# Patient Record
Sex: Female | Born: 1985
Health system: Southern US, Community
[De-identification: ages and names within clinical notes are randomized; demographics above are authoritative.]

## PROBLEM LIST (undated history)

## (undated) DIAGNOSIS — E785 Hyperlipidemia, unspecified: Secondary | ICD-10-CM

## (undated) DIAGNOSIS — E119 Type 2 diabetes mellitus without complications: Secondary | ICD-10-CM

## (undated) DIAGNOSIS — K219 Gastro-esophageal reflux disease without esophagitis: Secondary | ICD-10-CM

## (undated) DIAGNOSIS — N393 Stress incontinence (female) (male): Secondary | ICD-10-CM

## (undated) DIAGNOSIS — T7840XA Allergy, unspecified, initial encounter: Secondary | ICD-10-CM

## (undated) DIAGNOSIS — I10 Essential (primary) hypertension: Secondary | ICD-10-CM

## (undated) HISTORY — DX: Essential (primary) hypertension: I10

## (undated) HISTORY — DX: Allergy, unspecified, initial encounter: T78.40XA

## (undated) HISTORY — DX: Hyperlipidemia, unspecified: E78.5

## (undated) HISTORY — DX: Stress incontinence (female) (male): N39.3

---
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
## 2005-04-28 ENCOUNTER — Ambulatory Visit: Payer: Self-pay | Admitting: Internal Medicine

## 2005-05-19 ENCOUNTER — Other Ambulatory Visit: Admission: RE | Admit: 2005-05-19 | Discharge: 2005-05-19 | Payer: Self-pay | Admitting: Obstetrics and Gynecology

## 2006-01-06 ENCOUNTER — Other Ambulatory Visit: Admission: RE | Admit: 2006-01-06 | Discharge: 2006-01-06 | Payer: Self-pay | Admitting: Gynecology

## 2006-01-08 ENCOUNTER — Encounter: Admission: RE | Admit: 2006-01-08 | Discharge: 2006-01-08 | Payer: Self-pay | Admitting: *Deleted

## 2008-04-10 LAB — CONVERTED CEMR LAB: Pap Smear: NORMAL

## 2008-04-12 ENCOUNTER — Encounter (INDEPENDENT_AMBULATORY_CARE_PROVIDER_SITE_OTHER): Payer: Self-pay | Admitting: Nurse Practitioner

## 2008-04-12 LAB — CONVERTED CEMR LAB
GC Probe Amp, Genital: NEGATIVE
RPR Titer: NONREACTIVE

## 2008-04-13 ENCOUNTER — Encounter (INDEPENDENT_AMBULATORY_CARE_PROVIDER_SITE_OTHER): Payer: Self-pay | Admitting: Nurse Practitioner

## 2008-04-13 LAB — CONVERTED CEMR LAB: Pap Smear: NEGATIVE

## 2008-05-15 ENCOUNTER — Ambulatory Visit: Payer: Self-pay | Admitting: Nurse Practitioner

## 2008-05-15 DIAGNOSIS — Z6841 Body Mass Index (BMI) 40.0 and over, adult: Secondary | ICD-10-CM | POA: Insufficient documentation

## 2008-05-15 DIAGNOSIS — K029 Dental caries, unspecified: Secondary | ICD-10-CM | POA: Insufficient documentation

## 2008-05-15 DIAGNOSIS — F17200 Nicotine dependence, unspecified, uncomplicated: Secondary | ICD-10-CM | POA: Insufficient documentation

## 2008-05-15 DIAGNOSIS — R030 Elevated blood-pressure reading, without diagnosis of hypertension: Secondary | ICD-10-CM | POA: Insufficient documentation

## 2008-05-16 ENCOUNTER — Encounter (INDEPENDENT_AMBULATORY_CARE_PROVIDER_SITE_OTHER): Payer: Self-pay | Admitting: Nurse Practitioner

## 2008-05-23 ENCOUNTER — Encounter (INDEPENDENT_AMBULATORY_CARE_PROVIDER_SITE_OTHER): Payer: Self-pay | Admitting: Nurse Practitioner

## 2008-06-01 ENCOUNTER — Encounter (INDEPENDENT_AMBULATORY_CARE_PROVIDER_SITE_OTHER): Payer: Self-pay | Admitting: Nurse Practitioner

## 2016-02-13 DIAGNOSIS — Z202 Contact with and (suspected) exposure to infections with a predominantly sexual mode of transmission: Secondary | ICD-10-CM | POA: Diagnosis not present

## 2016-02-13 DIAGNOSIS — Z5181 Encounter for therapeutic drug level monitoring: Secondary | ICD-10-CM | POA: Diagnosis not present

## 2016-02-13 DIAGNOSIS — Z0000 Encounter for general adult medical examination without abnormal findings: Secondary | ICD-10-CM | POA: Diagnosis not present

## 2016-06-22 DIAGNOSIS — Z113 Encounter for screening for infections with a predominantly sexual mode of transmission: Secondary | ICD-10-CM | POA: Diagnosis not present

## 2016-06-22 DIAGNOSIS — N760 Acute vaginitis: Secondary | ICD-10-CM | POA: Diagnosis not present

## 2016-06-22 DIAGNOSIS — Z114 Encounter for screening for human immunodeficiency virus [HIV]: Secondary | ICD-10-CM | POA: Diagnosis not present

## 2016-06-22 DIAGNOSIS — Z01419 Encounter for gynecological examination (general) (routine) without abnormal findings: Secondary | ICD-10-CM | POA: Diagnosis not present

## 2016-06-22 DIAGNOSIS — Z6841 Body Mass Index (BMI) 40.0 and over, adult: Secondary | ICD-10-CM | POA: Diagnosis not present

## 2016-06-22 DIAGNOSIS — Z1159 Encounter for screening for other viral diseases: Secondary | ICD-10-CM | POA: Diagnosis not present

## 2016-07-13 ENCOUNTER — Ambulatory Visit: Payer: Self-pay | Admitting: Obstetrics and Gynecology

## 2016-07-20 ENCOUNTER — Ambulatory Visit (INDEPENDENT_AMBULATORY_CARE_PROVIDER_SITE_OTHER)
Admission: RE | Admit: 2016-07-20 | Discharge: 2016-07-20 | Disposition: A | Discharge status: Home / Self Care | Payer: BLUE CROSS/BLUE SHIELD | Source: Ambulatory Visit | Attending: Family Medicine | Admitting: Family Medicine

## 2016-07-20 ENCOUNTER — Encounter: Payer: Self-pay | Admitting: Family Medicine

## 2016-07-20 ENCOUNTER — Ambulatory Visit (INDEPENDENT_AMBULATORY_CARE_PROVIDER_SITE_OTHER): Payer: BLUE CROSS/BLUE SHIELD | Admitting: Family Medicine

## 2016-07-20 VITALS — BP 134/78 | HR 96 | Temp 97.8°F | Resp 12 | Ht 62.5 in | Wt 268.0 lb

## 2016-07-20 DIAGNOSIS — G8929 Other chronic pain: Secondary | ICD-10-CM | POA: Insufficient documentation

## 2016-07-20 DIAGNOSIS — M25562 Pain in left knee: Secondary | ICD-10-CM

## 2016-07-20 DIAGNOSIS — F17200 Nicotine dependence, unspecified, uncomplicated: Secondary | ICD-10-CM

## 2016-07-20 DIAGNOSIS — Z6841 Body Mass Index (BMI) 40.0 and over, adult: Secondary | ICD-10-CM | POA: Diagnosis not present

## 2016-07-20 DIAGNOSIS — M549 Dorsalgia, unspecified: Secondary | ICD-10-CM

## 2016-07-20 DIAGNOSIS — K625 Hemorrhage of anus and rectum: Secondary | ICD-10-CM | POA: Diagnosis not present

## 2016-07-20 DIAGNOSIS — M545 Low back pain, unspecified: Secondary | ICD-10-CM | POA: Insufficient documentation

## 2016-07-20 LAB — COMPREHENSIVE METABOLIC PANEL
ALBUMIN: 4.3 g/dL (ref 3.5–5.2)
ALK PHOS: 69 U/L (ref 39–117)
ALT: 13 U/L (ref 0–35)
AST: 11 U/L (ref 0–37)
BUN: 10 mg/dL (ref 6–23)
CALCIUM: 9.3 mg/dL (ref 8.4–10.5)
CHLORIDE: 104 meq/L (ref 96–112)
CO2: 28 mEq/L (ref 19–32)
CREATININE: 0.77 mg/dL (ref 0.40–1.20)
GFR: 112.87 mL/min (ref >60.00)
Glucose, Bld: 86 mg/dL (ref 70–99)
Potassium: 4.1 mEq/L (ref 3.5–5.1)
Sodium: 140 mEq/L (ref 135–145)
TOTAL PROTEIN: 6.8 g/dL (ref 6.0–8.3)
Total Bilirubin: 0.6 mg/dL (ref 0.2–1.2)

## 2016-07-20 LAB — CBC
HEMATOCRIT: 36.7 % (ref 36.0–46.0)
Hemoglobin: 12.2 g/dL (ref 12.0–15.0)
MCHC: 33.2 g/dL (ref 30.0–36.0)
MCV: 89.9 fl (ref 78.0–100.0)
Platelets: 379 10*3/uL (ref 150.0–400.0)
RBC: 4.08 Mil/uL (ref 3.87–5.11)
RDW: 13.5 % (ref 11.5–15.5)
WBC: 8.4 10*3/uL (ref 4.0–10.5)

## 2016-07-20 LAB — LIPID PANEL
CHOLESTEROL: 165 mg/dL (ref 0–200)
HDL: 34.7 mg/dL — ABNORMAL LOW (ref >39.00)
LDL CALC: 113 mg/dL — AB (ref 0–99)
NonHDL: 130.11
TRIGLYCERIDES: 87 mg/dL (ref 0.0–149.0)
Total CHOL/HDL Ratio: 5
VLDL: 17.4 mg/dL (ref 0.0–40.0)

## 2016-07-20 MED ORDER — CYCLOBENZAPRINE HCL 10 MG PO TABS: 10.0000 mg | ORAL_TABLET | Freq: Every evening | ORAL | 0 refills | Status: DC | PRN

## 2016-07-20 MED ORDER — NICOTINE POLACRILEX 4 MG MT GUM: CHEWING_GUM | OROMUCOSAL | 1 refills | Status: DC

## 2016-07-20 NOTE — Patient Instructions (Addendum)
A few things to remember from today's visit:   Rectal bleeding - Plan: Comprehensive metabolic panel, CBC  Chronic pain of left knee - Plan: DG Knee Complete 4 Views Left  Chronic left-sided low back pain without sciatica - Plan: cyclobenzaprine (FLEXERIL) 10 MG tablet, DG Lumbar Spine Complete  BMI 45.0-49.9, adult (HCC) - Plan: Lipid panel, Comprehensive metabolic panel  What are some tips for weight loss? People become overweight for many reasons. Weight issues can run in families. They can be caused by unhealthy behaviors and a person's environment. Certain health problems and medicines can also lead to weight gain. There are some simple things you can do to reach and maintain a healthy weight:  Eat small more frequent healthy meals instead 3 bid meals. Also Weight Watchers is a good option. Avoid sweet drinks. These include regular soft drinks, fruit juices, fruit drinks, energy drinks, sweetened iced tea, and flavored milk. Avoid fast foods. Fast foods such as french fries, hamburgers, chicken nuggets, and pizza are high in calories and can cause weight gain. Eat a healthy breakfast. People who skip breakfast tend to weigh more. Don't watch more than two hours of television per day. Chew sugar-free gum between meals to cut down on snacking. Avoid grocery shopping when you're hungry. Pack a healthy lunch instead of eating out to control what and how much you eat. Eat a lot of fruits and vegetables. Aim for about 2 cups of fruit and 2 to 3 cups of vegetables per day. Aim for 150 minutes per week of moderate-intensity exercise (such as brisk walking), or 75 minutes per week of vigorous exercise (such as jogging or running). OR 15-30 min of daily brisk walking. Be more active. Small changes in physical activity can easily be added to your daily routine. For example, take the stairs instead of the elevator. Take a walk with your family. A daily walk is a great way to get exercise and to  catch up on the day's events.  Flexeril is a muscle relaxant that can cause drowsiness, so take it at night.  Please be sure medication list is accurate. If a new problem present, please set up appointment sooner than planned today.
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
## 2016-07-22 ENCOUNTER — Other Ambulatory Visit: Payer: Self-pay

## 2016-07-22 ENCOUNTER — Telehealth: Payer: Self-pay | Admitting: Family Medicine

## 2016-07-22 DIAGNOSIS — M25562 Pain in left knee: Principal | ICD-10-CM

## 2016-07-22 DIAGNOSIS — G8929 Other chronic pain: Secondary | ICD-10-CM

## 2016-07-22 NOTE — Telephone Encounter (Signed)
Patient informed of results and verbalized understanding.

## 2016-07-22 NOTE — Telephone Encounter (Signed)
Pt would like results of labs. Please call back. °

## 2016-09-21 DIAGNOSIS — M25562 Pain in left knee: Secondary | ICD-10-CM | POA: Diagnosis not present

## 2016-09-21 DIAGNOSIS — M25532 Pain in left wrist: Secondary | ICD-10-CM | POA: Diagnosis not present

## 2016-09-21 DIAGNOSIS — M25531 Pain in right wrist: Secondary | ICD-10-CM | POA: Diagnosis not present

## 2017-01-21 ENCOUNTER — Ambulatory Visit: Payer: BLUE CROSS/BLUE SHIELD | Admitting: Family Medicine

## 2017-03-02 ENCOUNTER — Encounter: Payer: Self-pay | Admitting: Family Medicine

## 2017-03-02 ENCOUNTER — Ambulatory Visit (INDEPENDENT_AMBULATORY_CARE_PROVIDER_SITE_OTHER): Payer: BLUE CROSS/BLUE SHIELD | Admitting: Family Medicine

## 2017-03-02 VITALS — BP 128/80 | HR 80 | Temp 97.9°F | Resp 12 | Ht 62.5 in | Wt 283.0 lb

## 2017-03-02 DIAGNOSIS — H9203 Otalgia, bilateral: Secondary | ICD-10-CM | POA: Diagnosis not present

## 2017-03-02 DIAGNOSIS — J029 Acute pharyngitis, unspecified: Secondary | ICD-10-CM | POA: Diagnosis not present

## 2017-03-02 DIAGNOSIS — J069 Acute upper respiratory infection, unspecified: Secondary | ICD-10-CM | POA: Diagnosis not present

## 2017-03-02 LAB — POCT RAPID STREP A (OFFICE): Rapid Strep A Screen: NEGATIVE

## 2017-03-02 MED ORDER — BENZONATATE 100 MG PO CAPS: 200.0000 mg | ORAL_CAPSULE | Freq: Two times a day (BID) | ORAL | 0 refills | Status: AC | PRN

## 2017-03-02 MED ORDER — FLUTICASONE PROPIONATE 50 MCG/ACT NA SUSP: 1.0000 | Freq: Two times a day (BID) | NASAL | 0 refills | Status: DC

## 2017-03-02 NOTE — Patient Instructions (Addendum)
A few things to remember from today's visit:  ACUTE VISIT:  Acute pharyngitis, unspecified etiology  URI, acute - Plan: fluticasone (FLONASE) 50 MCG/ACT nasal spray, benzonatate (TESSALON) 100 MG capsule  Earache symptoms in both ears  viral infections are self-limited and we treat each symptom depending of severity.  Over the counter medications as decongestants and cold medications usually help, they need to be taken with caution if there is a history of high blood pressure or palpitations.  Plenty of fluids. Honey helps with cough. Steam inhalations helps with runny nose, nasal congestion, and may prevent sinus infections. Cough and nasal congestion could last a few days and sometimes weeks. Because smoking symptoms may last longer.   Popping ears may help with ear ache, ? Fluid in ears.  Symptomatic treatment: Over the counter Acetaminophen 500 mg and/or Ibuprofen (400-600 mg) if there is not contraindications; you can alternate in between both every 4-6 hours. Gargles with saline water and throat lozenges might also help. Cold fluids.    Seek prompt medical evaluation if you are having difficulty breathing, mouth swelling, throat closing up, not able to swallow liquids (drooling), skin rash/bruising, or worsening symptoms.  Please follow up in 2 weeks if not any better.    Please be sure medication list is accurate. If a new problem present, please set up appointment sooner than planned today.

## 2017-03-02 NOTE — Progress Notes (Signed)
HPI:  ACUTE VISIT  Chief Complaint  Patient presents with  . Sore Throat    Ms.GrenadaBrittany Jerilynn Birkenheadicole Thompson is a 31 y.o.female here today with her female friend complaining of 7 days of respiratory symptoms.  Initially she had fever and chills but these resolved 1-2 days later. + Sore throat, she would like to be checked for strep.  Sore Throat   This is a new problem. The current episode started in the past 7 days. Neither side of throat is experiencing more pain than the other. The pain is moderate. Associated symptoms include coughing and ear pain. Pertinent negatives include no abdominal pain, congestion, diarrhea, ear discharge, headaches, hoarse voice, neck pain, shortness of breath, stridor, swollen glands, trouble swallowing or vomiting. She has had no exposure to strep or mono. She has tried NSAIDs for the symptoms. The treatment provided mild relief.   + Bilateral earache L>R. Denies hearing loss or ear drainage.  Productive cough with clear "mucus." She has not noted nasal congestion or rhinorrhea but has had post nasal drainage. She is not longer having fever,chills,or body aches. She deneis chest pain, dyspnea, or wheezing.  No Hx of recent travel. No sick contact. No known insect bite. + Smoker.  Hx of allergies: yes, she takes Claritin as needed.  OTC medications for this problem: Ibuprofen, none today.  Symptoms otherwise stable.   Review of Systems  Constitutional: Positive for fatigue. Negative for activity change, appetite change and fever.  HENT: Positive for ear pain, postnasal drip and sore throat. Negative for congestion, ear discharge, hoarse voice, mouth sores, nosebleeds, rhinorrhea, sinus pressure, trouble swallowing and voice change.   Eyes: Negative for discharge, redness and itching.  Respiratory: Positive for cough. Negative for shortness of breath, wheezing and stridor.   Cardiovascular: Negative for chest pain and leg swelling.    Gastrointestinal: Negative for abdominal pain, diarrhea, nausea and vomiting.  Musculoskeletal: Negative for myalgias and neck pain.  Skin: Negative for pallor and rash.  Allergic/Immunologic: Positive for environmental allergies.  Neurological: Negative for weakness and headaches.  Hematological: Negative for adenopathy. Does not bruise/bleed easily.    Current Outpatient Prescriptions on File Prior to Visit  Medication Sig Dispense Refill  . nicotine polacrilex (NICORETTE) 4 MG gum q 3 hours as needed for smoking cessation. 150 tablet 1   No current facility-administered medications on file prior to visit.      Past Medical History:  Diagnosis Date  . Stress incontinence of urine    Not on File  Social History   Social History  . Marital status: Single    Spouse name: N/A  . Number of children: N/A  . Years of education: N/A   Social History Main Topics  . Smoking status: Current Every Day Smoker  . Smokeless tobacco: Never Used  . Alcohol use Yes     Comment: social  . Drug use: Yes    Types: Marijuana  . Sexual activity: Not Asked   Other Topics Concern  . None   Social History Narrative  . None    Vitals:   03/02/17 1556  BP: 128/80  Pulse: 80  Resp: 12  Temp: 97.9 F (36.6 C)   Body mass index is 50.94 kg/m.   Physical Exam  Nursing note and vitals reviewed. Constitutional: She is oriented to person, place, and time. She appears well-developed. She does not appear ill. No distress.  HENT:  Head: Atraumatic.  Right Ear: Tympanic membrane, external ear  and ear canal normal.  Left Ear: Tympanic membrane, external ear and ear canal normal.  Nose: Rhinorrhea present. Right sinus exhibits no maxillary sinus tenderness and no frontal sinus tenderness. Left sinus exhibits no maxillary sinus tenderness and no frontal sinus tenderness.  Mouth/Throat: Uvula is midline, oropharynx is clear and moist and mucous membranes are normal.  Cerumen in both ear  canals, moderate amount. Hypertrophic turbinates. Post nasal drainage.   Eyes: Conjunctivae and EOM are normal.  Neck: No muscular tenderness present.  Cardiovascular: Normal rate and regular rhythm.   No murmur heard. Respiratory: Effort normal and breath sounds normal. No stridor. No respiratory distress.  Lymphadenopathy:       Head (right side): No submandibular adenopathy present.       Head (left side): No submandibular adenopathy present.    She has no cervical adenopathy.  Neurological: She is alert and oriented to person, place, and time. She has normal strength.  Skin: Skin is warm. No rash noted. No erythema.  Psychiatric: She has a normal mood and affect. Her speech is normal.  Well groomed, good eye contact.    ASSESSMENT AND PLAN:   Meredith was seen today for sore throat.  Diagnoses and all orders for this visit:  Acute pharyngitis, unspecified etiology  We discussed possible etiologies, examination does not suggest strep infection. Rapid strep negative. We will follow Cx and will given recommendations accordingly. Symptomatic treatment recommend.   URI, acute  Symptoms suggests a viral etiology, now having residual symptoms, not longer having fever. Instructed to monitor for signs of complications, including new onset of fever among some, clearly instructed about warning signs. I also explained that cough and nasal congestion can last a few days and sometimes weeks. F/U as needed.   -     fluticasone (FLONASE) 50 MCG/ACT nasal spray; Place 1 spray into both nostrils 2 (two) times daily. -     benzonatate (TESSALON) 100 MG capsule; Take 2 capsules (200 mg total) by mouth 2 (two) times daily as needed for cough.  Earache symptoms in both ears  TM on exam is not erythematous. ? Eustachian tube dysfunction. OTC decongestants may help, some side effects discussed. Auto inflation maneuvers may also help. F/U as needed.    -[name] was  advised to seek attention immediately if symptoms worsen or to follow if they persist or new concerns arise. She also needs to arrange CPE around 07/2017.      Betty G. [place], MD  [facility]. [place] office.

## 2017-03-04 ENCOUNTER — Encounter: Payer: Self-pay | Admitting: Family Medicine

## 2017-03-04 LAB — CULTURE, GROUP A STREP

## 2017-03-15 NOTE — Progress Notes (Signed)
HPI:   [name] is a 31 y.o. female, who is here today with her boyfriend for her routine physical. She is planning on going back to school, [employer] she needs form fill out and vaccines update.  As part of her program she will travel overseas, not sure which country, but she thinks it will be in [place].   Regular exercise 3 or more time per week: Not consistently. Following a healthy diet: Not consistently. She lives with her mother,step father, and her boyfriend.  Chronic medical problems: Tobacco use disorder,obesity, and allergies.  She follows with her gyn for her routine preventive gyn care.  She has Mirena IUD and she is c/o having intermittent vaginal spotting since it was placed,2017. LMP 3-4 months ago.  She is also concerned about diabetes. According to her boyfriend, she gets "very sleepy" every time she eats "a lot of sweets."She states that lately she has been [place] lot" of  ice cream and sweets, she has noted wt gain. She is concerned about diabetes.   Review of Systems  Constitutional: Positive for fatigue. Negative for appetite change, fever and unexpected weight change.  HENT: Negative for dental problem, hearing loss, mouth sores, sore throat, trouble swallowing and voice change.   Eyes: Negative for redness and visual disturbance.  Respiratory: Negative for cough, shortness of breath and wheezing.   Cardiovascular: Negative for chest pain, palpitations and leg swelling.  Gastrointestinal: Negative for abdominal pain, nausea and vomiting.       No changes in bowel habits.  Endocrine: Negative for cold intolerance, heat intolerance, polydipsia, polyphagia and polyuria.  Genitourinary: Positive for menstrual problem and vaginal bleeding. Negative for decreased urine volume, dysuria, hematuria and vaginal discharge.  Musculoskeletal: Negative for gait problem and myalgias.  Skin: Negative for color  change and rash.  Allergic/Immunologic: Positive for environmental allergies.  Neurological: Negative for syncope, weakness and headaches.  Hematological: Negative for adenopathy. Does not bruise/bleed easily.  Psychiatric/Behavioral: Negative for confusion and sleep disturbance. The patient is not nervous/anxious.   All other systems reviewed and are negative.     Current Outpatient Prescriptions on File Prior to Visit  Medication Sig Dispense Refill  . fluticasone (FLONASE) 50 MCG/ACT nasal spray Place 1 spray into both nostrils 2 (two) times daily. 16 g 0  . nicotine polacrilex (NICORETTE) 4 MG gum q 3 hours as needed for smoking cessation. 150 tablet 1   No current facility-administered medications on file prior to visit.      Past Medical History:  Diagnosis Date  . Allergy   . Stress incontinence of urine    Past Surgical History:  Procedure Laterality Date  . TONSILLECTOMY AND ADENOIDECTOMY  1999    No Known Allergies  Family History  Problem Relation Age of Onset  . Diabetes Mother   . Hyperlipidemia Mother   . Heart disease Mother        CHF  . Hypertension Mother   . Mental illness Mother   . Diabetes Father   . Hyperlipidemia Father   . Hypertension Father     Social History   Social History  . Marital status: Single    Spouse name: N/A  . Number of children: N/A  . Years of education: N/A   Social History Main Topics  . Smoking status: Current Every Day Smoker  . Smokeless tobacco: Never Used  . Alcohol use Yes     Comment: social  . Drug  use: Yes    Types: Marijuana  . Sexual activity: Yes    Birth control/ protection: IUD   Other Topics Concern  . None   Social History Narrative  . None     Vitals:   03/16/17 1357  BP: 120/80  Pulse: 87  Resp: 12   Body mass index is 49.68 kg/m.  O2 sat at RA 98%  Wt Readings from Last 3 Encounters:  03/16/17 276 lb (125.2 kg)  03/02/17 283 lb (128.4 kg)  07/20/16 268 lb (121.6 kg)     Physical Exam  Nursing note and vitals reviewed. Constitutional: She is oriented to person, place, and time. She appears well-developed. No distress.  HENT:  Head: Atraumatic.  Right Ear: Hearing, tympanic membrane, external ear and ear canal normal.  Left Ear: Hearing, tympanic membrane, external ear and ear canal normal.  Mouth/Throat: Uvula is midline, oropharynx is clear and moist and mucous membranes are normal.  Eyes: Conjunctivae and EOM are normal. Pupils are equal, round, and reactive to light.  Neck: No tracheal deviation present. No thyromegaly present.  Cardiovascular: Normal rate and regular rhythm.   No murmur heard. Pulses:      Dorsalis pedis pulses are 2+ on the right side, and 2+ on the left side.  Respiratory: Effort normal and breath sounds normal. No respiratory distress.  GI: Soft. She exhibits no mass. There is no hepatomegaly. There is no tenderness.  Musculoskeletal: She exhibits edema (trace pitting edema,bilateral).  No major deformity or signs of synovitis appreciated.  Lymphadenopathy:    She has no cervical adenopathy.       Right: No supraclavicular adenopathy present.       Left: No supraclavicular adenopathy present.  Neurological: She is alert and oriented to person, place, and time. She has normal strength. No cranial nerve deficit. Coordination and gait normal.  Reflex Scores:      Bicep reflexes are 2+ on the right side and 2+ on the left side.      Patellar reflexes are 2+ on the right side and 2+ on the left side. Skin: Skin is warm. No rash noted. No erythema.  Psychiatric: She has a normal mood and affect. Cognition and memory are normal.  Well groomed, good eye contact.    ASSESSMENT AND PLAN:   Diagnoses and all orders for this visit:  Routine physical examination   We discussed the importance of regular physical activity and healthy diet for prevention of chronic illness and/or complications. Preventive guidelines  reviewed. Vaccination updated. Because possible travel to [place] country Hep A also recommended.  Continue following with gyn for her routine pap smear. Also recommended scheduling f/u appt for Mirena check and intermittent vaginal spotting, recommend using condoms until verification that IUD is in place.  Form for school completed. Next CPE in 1-3 years.  -     POCT glycosylated hemoglobin (Hb A1C)  Diabetes mellitus screening -     POCT glycosylated hemoglobin (Hb A1C)  BMI 45.0-49.9, adult (HCC)  We discussed benefits of wt loss as well as adverse effects of obesity. Consistency with healthy diet and physical activity recommended. Portion controlled and aily brisk walking for 15-30 min as tolerated. A1C today mildly elevated, 5.8.  -     POCT glycosylated hemoglobin (Hb A1C)  Need for Tdap vaccination -     Tdap vaccine greater than or equal to 7yo IM  Need for hepatitis A and B vaccination -     Hepatitis A hepatitis  B combined vaccine IM      Kimbella Heisler G. [place], MD  [facility]. [place] office.

## 2017-03-16 ENCOUNTER — Ambulatory Visit: Payer: BLUE CROSS/BLUE SHIELD | Admitting: Family Medicine

## 2017-03-16 ENCOUNTER — Ambulatory Visit (INDEPENDENT_AMBULATORY_CARE_PROVIDER_SITE_OTHER): Payer: BLUE CROSS/BLUE SHIELD | Admitting: Family Medicine

## 2017-03-16 ENCOUNTER — Encounter: Payer: Self-pay | Admitting: Family Medicine

## 2017-03-16 VITALS — BP 120/80 | HR 87 | Resp 12 | Ht 62.5 in | Wt 276.0 lb

## 2017-03-16 DIAGNOSIS — Z131 Encounter for screening for diabetes mellitus: Secondary | ICD-10-CM | POA: Diagnosis not present

## 2017-03-16 DIAGNOSIS — Z23 Encounter for immunization: Secondary | ICD-10-CM

## 2017-03-16 DIAGNOSIS — Z0000 Encounter for general adult medical examination without abnormal findings: Secondary | ICD-10-CM

## 2017-03-16 DIAGNOSIS — Z6841 Body Mass Index (BMI) 40.0 and over, adult: Secondary | ICD-10-CM

## 2017-03-16 LAB — POCT GLYCOSYLATED HEMOGLOBIN (HGB A1C): Hemoglobin A1C: 5.8

## 2017-03-16 NOTE — Patient Instructions (Signed)
A few things to remember from today's visit:   Routine physical examination - Plan: POCT glycosylated hemoglobin (Hb A1C)  Diabetes mellitus screening - Plan: POCT glycosylated hemoglobin (Hb A1C)  BMI 45.0-49.9, adult (HCC) - Plan: POCT glycosylated hemoglobin (Hb A1C)   At least 150 minutes of moderate exercise per week, daily brisk walking for 15-30 min is a good exercise option. Healthy diet low in saturated (animal) fats and sweets and consisting of fresh fruits and vegetables, lean meats such as fish and white chicken and whole grains.   - Vaccines:  Tdap vaccine every 10 years.  Shingles vaccine recommended at age 31, could be given after 31 years of age but not sure about insurance coverage.  Pneumonia vaccines:  Prevnar 13 at 65 and Pneumovax at 66.  Screening recommendations for low/normal risk women:  Screening for diabetes at age 31-45 and every 3 years.  Cervical cancer prevention:  -HPV vaccination between 649-31 years old. -Pap smear starts at 31 years of age and continues periodically until 40108 years old in low risk women. Pap smear every 3 years between 7621 and 31 years old. Pap smear every 3 years between women 30 and older if pap smear negative and HPV screening negative.   -Breast cancer: Mammogram: There is disagreement between experts about when to start screening in low risk asymptomatic female but recent recommendations are to start screening at 640 and not later than 31 years old , every 1-2 years and after 31 yo q 2 years. Screening is recommended until 31 years old but some women can continue screening depending of healthy issues.   Colon cancer screening: starts at 31 years old until 31 years old.  Cholesterol disorder screening at age 31 and every 3 years.  Also recommended:  1. Dental visit- Brush and floss your teeth twice daily; visit your dentist twice a year. 2. Eye doctor- Get an eye exam at least every 2 years. 3. Helmet use- Always wear a  helmet when riding a bicycle, motorcycle, rollerblading or skateboarding. 4. Safe sex- If you may be exposed to sexually transmitted infections, use a condom. 5. Seat belts- Seat belts can save your live; always wear one. 6. Smoke/Carbon Monoxide detectors- These detectors need to be installed on the appropriate level of your home. Replace batteries at least once a year. 7. Skin cancer- When out in the sun please cover up and use sunscreen 15 SPF or higher. 8. Violence- If anyone is threatening or hurting you, please tell your healthcare provider.  9. Drink alcohol in moderation- Limit alcohol intake to one drink or less per day. Never drink and drive.  Please be sure medication list is accurate. If a new problem present, please set up appointment sooner than planned today.

## 2017-04-16 ENCOUNTER — Ambulatory Visit (INDEPENDENT_AMBULATORY_CARE_PROVIDER_SITE_OTHER): Payer: BLUE CROSS/BLUE SHIELD | Admitting: *Deleted

## 2017-04-16 DIAGNOSIS — Z23 Encounter for immunization: Secondary | ICD-10-CM

## 2017-04-16 NOTE — Progress Notes (Signed)
Patient here for second Twinrix vaccine; 1st dose given on 03/16/17; patient to return for 3rd twinrix around 09/15/2017 per CDC schedule and MD orders;  administered 2nd Twinrix vaccine IM to lt deltoid; patient tolerated well; no s/s of reactions; reviewed potential side effects; understanding voiced

## 2017-06-04 DIAGNOSIS — B372 Candidiasis of skin and nail: Secondary | ICD-10-CM | POA: Diagnosis not present

## 2017-06-04 DIAGNOSIS — R030 Elevated blood-pressure reading, without diagnosis of hypertension: Secondary | ICD-10-CM | POA: Diagnosis not present

## 2017-06-24 ENCOUNTER — Encounter: Payer: Self-pay | Admitting: Family Medicine

## 2017-09-08 DIAGNOSIS — Z30431 Encounter for routine checking of intrauterine contraceptive device: Secondary | ICD-10-CM | POA: Diagnosis not present

## 2017-09-08 DIAGNOSIS — N760 Acute vaginitis: Secondary | ICD-10-CM | POA: Diagnosis not present

## 2017-09-08 DIAGNOSIS — Z01419 Encounter for gynecological examination (general) (routine) without abnormal findings: Secondary | ICD-10-CM | POA: Diagnosis not present

## 2017-09-08 DIAGNOSIS — Z3202 Encounter for pregnancy test, result negative: Secondary | ICD-10-CM | POA: Diagnosis not present

## 2017-09-08 DIAGNOSIS — R030 Elevated blood-pressure reading, without diagnosis of hypertension: Secondary | ICD-10-CM | POA: Diagnosis not present

## 2017-09-23 ENCOUNTER — Ambulatory Visit (INDEPENDENT_AMBULATORY_CARE_PROVIDER_SITE_OTHER): Payer: BLUE CROSS/BLUE SHIELD

## 2017-09-23 DIAGNOSIS — Z23 Encounter for immunization: Secondary | ICD-10-CM

## 2017-10-15 ENCOUNTER — Ambulatory Visit: Payer: Self-pay | Admitting: Family Medicine

## 2017-10-15 DIAGNOSIS — Z0289 Encounter for other administrative examinations: Secondary | ICD-10-CM

## 2017-11-17 ENCOUNTER — Encounter: Payer: Self-pay | Admitting: Family Medicine

## 2017-11-17 ENCOUNTER — Ambulatory Visit: Payer: BLUE CROSS/BLUE SHIELD | Admitting: Family Medicine

## 2017-11-17 VITALS — BP 126/84 | HR 92 | Temp 98.5°F | Resp 12 | Ht 63.0 in | Wt 273.2 lb

## 2017-11-17 DIAGNOSIS — R0989 Other specified symptoms and signs involving the circulatory and respiratory systems: Secondary | ICD-10-CM | POA: Diagnosis not present

## 2017-11-17 DIAGNOSIS — J988 Other specified respiratory disorders: Secondary | ICD-10-CM | POA: Diagnosis not present

## 2017-11-17 DIAGNOSIS — K219 Gastro-esophageal reflux disease without esophagitis: Secondary | ICD-10-CM

## 2017-11-17 DIAGNOSIS — J989 Respiratory disorder, unspecified: Secondary | ICD-10-CM

## 2017-11-17 LAB — POC INFLUENZA A&B (BINAX/QUICKVUE)
Influenza A, POC: NEGATIVE
Influenza B, POC: NEGATIVE

## 2017-11-17 MED ORDER — FLUTICASONE PROPIONATE 50 MCG/ACT NA SUSP: 1.0000 | Freq: Two times a day (BID) | NASAL | 3 refills | Status: DC

## 2017-11-17 MED ORDER — ALBUTEROL SULFATE HFA 108 (90 BASE) MCG/ACT IN AERS: 2.0000 | INHALATION_SPRAY | Freq: Four times a day (QID) | RESPIRATORY_TRACT | 0 refills | Status: DC | PRN

## 2017-11-17 MED ORDER — AZITHROMYCIN 250 MG PO TABS: ORAL_TABLET | ORAL | 0 refills | Status: DC

## 2017-11-17 MED ORDER — PANTOPRAZOLE SODIUM 40 MG PO TBEC: 40.0000 mg | DELAYED_RELEASE_TABLET | Freq: Every day | ORAL | 1 refills | Status: DC

## 2017-11-17 NOTE — Patient Instructions (Addendum)
A few things to remember from today's visit:   URI, acute - Plan: POC Influenza A&B (Binax test)  Respiratory tract infection - Plan: DG Chest 2 View, albuterol (PROVENTIL HFA;VENTOLIN HFA) 108 (90 Base) MCG/ACT inhaler, azithromycin (ZITHROMAX) 250 MG tablet  Reactive airway disease that is not asthma - Plan: DG Chest 2 View, albuterol (PROVENTIL HFA;VENTOLIN HFA) 108 (90 Base) MCG/ACT inhaler  Gastroesophageal reflux disease, esophagitis presence not specified - Plan: pantoprazole (PROTONIX) 40 MG tablet  Albuterol inh 2 puff every 6 hours for a week then as needed for wheezing or shortness of breath.   Today X ray was ordered.  This [facility] done at [facility] [address] between 8 am and 5 pm: [address] [phone].  Nasal irrigations with saline.   Please be sure medication list is accurate. If a new problem present, please set up appointment sooner than planned today.

## 2017-11-17 NOTE — Progress Notes (Signed)
ACUTE VISIT   HPI:  Chief Complaint  Patient presents with  . Cough    started after Christmas, off and on, tried OTC medications  . Nasal Congestion    Ms.Lowanda FosterBrittany Jerilynn Birkenheadicole Prichett is a 32 y.o. female, who is here today complaining of productive cough and nasal congestion intermittently since 09/2017,around christmas.  Initially she had sore throat and left earache,both resolved. She has had fever a couple times,last time she checked temp was 3 days ago and 100.9 F Yellowish/greenish sputum. Intermittent wheezing and exertional dyspnea. Symptoms are aggravated by exercise and alleviated by rest.  She has tried OTC Robitussin.  + Heartburn,"really bad",she takes OTC Roll aids.  Denies abdominal pain, nausea, vomiting, changes in bowel habits, or melena. Hx of allergies.    Review of Systems  Constitutional: Positive for fatigue and fever. Negative for activity change and appetite change.  HENT: Positive for congestion, postnasal drip and rhinorrhea. Negative for ear pain, facial swelling, mouth sores, sore throat, trouble swallowing and voice change.   Eyes: Negative for discharge and redness.  Respiratory: Positive for cough, shortness of breath and wheezing. Negative for chest tightness.   Cardiovascular: Negative for chest pain.  Gastrointestinal: Negative for abdominal pain, diarrhea, nausea and vomiting.  Musculoskeletal: Negative for arthralgias, myalgias and neck pain.  Skin: Negative for rash.  Allergic/Immunologic: Positive for environmental allergies.  Neurological: Negative for syncope, weakness and headaches.  Hematological: Negative for adenopathy. Does not bruise/bleed easily.  Psychiatric/Behavioral: Negative for confusion. The patient is nervous/anxious.       No current outpatient medications on file prior to visit.   No current facility-administered medications on file prior to visit.      Past Medical History:  Diagnosis Date  . Allergy     . Stress incontinence of urine    No Known Allergies  Social History   Socioeconomic History  . Marital status: Single    Spouse name: None  . Number of children: None  . Years of education: None  . Highest education level: None  Social Needs  . Financial resource strain: None  . Food insecurity - worry: None  . Food insecurity - inability: None  . Transportation needs - medical: None  . Transportation needs - non-medical: None  Occupational History  . None  Tobacco Use  . Smoking status: Current Every Day Smoker  . Smokeless tobacco: Never Used  Substance and Sexual Activity  . Alcohol use: Yes    Comment: social  . Drug use: Yes    Types: Marijuana  . Sexual activity: Yes    Birth control/protection: IUD  Other Topics Concern  . None  Social History Narrative  . None    Vitals:   11/17/17 1209  BP: 126/84  Pulse: 92  Resp: 12  Temp: 98.5 F (36.9 C)  SpO2: 98%   Body mass index is 48.4 kg/m.    Physical Exam  Nursing note and vitals reviewed. Constitutional: She is oriented to person, place, and time. She appears well-developed. She does not appear ill. No distress.  HENT:  Head: Normocephalic and atraumatic.  Right Ear: Tympanic membrane, external ear and ear canal normal.  Left Ear: Tympanic membrane, external ear and ear canal normal.  Nose: Rhinorrhea present. Right sinus exhibits no maxillary sinus tenderness and no frontal sinus tenderness. Left sinus exhibits no maxillary sinus tenderness and no frontal sinus tenderness.  Mouth/Throat: Oropharynx is clear and moist and mucous membranes are normal.  Eyes: Conjunctivae  are normal. Pupils are equal, round, and reactive to light.  Cardiovascular: Normal rate and regular rhythm.  No murmur heard. Respiratory: Effort normal and breath sounds normal. No respiratory distress.  GI: Soft. She exhibits no mass. There is no tenderness.  Musculoskeletal: She exhibits no edema or tenderness.   Lymphadenopathy:    She has no cervical adenopathy.  Neurological: She is alert and oriented to person, place, and time. She has normal strength. Gait normal.  Skin: Skin is warm. No rash noted. No erythema.  Psychiatric: She has a normal mood and affect.  Well groomed, good eye contact.     ASSESSMENT AND PLAN:  Ms. Natasha was seen today for cough and nasal congestion.  Diagnoses and all orders for this visit:  Reactive airway disease that is not asthma  Encouraged smoking cessation. ? COPD. Albuterol inh 2 puff every 6 hours for a week then as needed for wheezing or shortness of breath.  I do not think Prednisone is needed at this time, no wheezing today.  -     DG Chest 2 View; Future -     albuterol (PROVENTIL HFA;VENTOLIN HFA) 108 (90 Base) MCG/ACT inhaler; Inhale 2 puffs into the lungs every 6 (six) hours as needed for wheezing or shortness of breath.  Respiratory tract infection  Because persistent respiratory symptoms,abx treatment recommended. Some side effects of Azithromycin discussed. Further recommendations will be given according to maging results.  -     POC Influenza A&B (Binax test) -     DG Chest 2 View; Future -     albuterol (PROVENTIL HFA;VENTOLIN HFA) 108 (90 Base) MCG/ACT inhaler; Inhale 2 puffs into the lungs every 6 (six) hours as needed for wheezing or shortness of breath. -     azithromycin (ZITHROMAX) 250 MG tablet; 2 tabs day one then 1 tab daily for 4 days. -     fluticasone (FLONASE) 50 MCG/ACT nasal spray; Place 1 spray into both nostrils 2 (two) times daily.  Gastroesophageal reflux disease, esophagitis presence not specified  Problem can also be contributing to persistent cough. Omeprazole is not covered by her health insurance and she cannot afford it OTC.  -     pantoprazole (PROTONIX) 40 MG tablet; Take 1 tablet (40 mg total) by mouth daily.     Return in about 3 weeks (around 12/08/2017) for GERD,cough.   -Ms.Shona Pardo was advised to seek immediate medical attention if sudden worsening symptoms.         Shaquel Chavous G. [place], MD  [facility]. [place] office.

## 2017-11-22 ENCOUNTER — Ambulatory Visit (INDEPENDENT_AMBULATORY_CARE_PROVIDER_SITE_OTHER)
Admission: RE | Admit: 2017-11-22 | Discharge: 2017-11-22 | Disposition: A | Discharge status: Home / Self Care | Payer: BLUE CROSS/BLUE SHIELD | Source: Ambulatory Visit | Attending: Family Medicine | Admitting: Family Medicine

## 2017-11-22 DIAGNOSIS — J989 Respiratory disorder, unspecified: Secondary | ICD-10-CM

## 2017-11-22 DIAGNOSIS — R0989 Other specified symptoms and signs involving the circulatory and respiratory systems: Secondary | ICD-10-CM

## 2017-11-22 DIAGNOSIS — R0602 Shortness of breath: Secondary | ICD-10-CM | POA: Diagnosis not present

## 2017-11-22 DIAGNOSIS — R05 Cough: Secondary | ICD-10-CM | POA: Diagnosis not present

## 2017-11-22 DIAGNOSIS — J988 Other specified respiratory disorders: Secondary | ICD-10-CM

## 2017-11-24 ENCOUNTER — Encounter: Payer: Self-pay | Admitting: Family Medicine

## 2017-12-07 NOTE — Progress Notes (Signed)
HPI:   Meredith Thompson is a 32 y.o. female, who is here today to follow on recent OV.   She was seen on 11/17/2017, when she was complaining of persistent respiratory symptoms. Persistent wheezing and cough since Christmas 2018. She was treated with Azithromycin and Albuterol inhaler.    She is reporting improvement of symptoms, still having intermittent nonproductive cough and wheezing associated with moderate to intense exertion. Last week she had 4 days of subjective fever.  She also has sore throat, nasal congestion, and earache occasionally in the morning. She denies ear drainage or hearing loss.  Still smoking, 1-2 cigarettes daily.  She denies orthopnea or PND.  GERD:  Last visit she was started on Protonix 40 mg daily. Heartburn has improved greatly.  Denies abdominal pain, nausea, vomiting, changes in bowel habits, blood in stool or melena.  In general she does not follow a healthy diet nor exercise regularly.     Review of Systems  Constitutional: Negative for activity change, appetite change, fatigue and fever.  HENT: Positive for congestion, ear pain, postnasal drip and sore throat. Negative for mouth sores, sinus pressure, trouble swallowing and voice change.   Eyes: Negative for redness and visual disturbance.  Respiratory: Positive for cough and wheezing. Negative for shortness of breath.   Cardiovascular: Negative for leg swelling.  Gastrointestinal: Negative for abdominal pain, nausea and vomiting.       No changes in bowel habits.  Musculoskeletal: Negative for gait problem and myalgias.  Allergic/Immunologic: Positive for environmental allergies.  Neurological: Negative for syncope, weakness and headaches.  Psychiatric/Behavioral: Negative for confusion. The patient is nervous/anxious.       Current Outpatient Medications on File Prior to Visit  Medication Sig Dispense Refill  . albuterol (PROVENTIL HFA;VENTOLIN HFA) 108 (90 Base)  MCG/ACT inhaler Inhale 2 puffs into the lungs every 6 (six) hours as needed for wheezing or shortness of breath. 1 Inhaler 0  . fluticasone (FLONASE) 50 MCG/ACT nasal spray Place 1 spray into both nostrils 2 (two) times daily. 16 g 3  . pantoprazole (PROTONIX) 40 MG tablet Take 1 tablet (40 mg total) by mouth daily. 30 tablet 1   No current facility-administered medications on file prior to visit.      Past Medical History:  Diagnosis Date  . Allergy   . Stress incontinence of urine    No Known Allergies  Social History   Socioeconomic History  . Marital status: Single    Spouse name: None  . Number of children: None  . Years of education: None  . Highest education level: None  Social Needs  . Financial resource strain: None  . Food insecurity - worry: None  . Food insecurity - inability: None  . Transportation needs - medical: None  . Transportation needs - non-medical: None  Occupational History  . None  Tobacco Use  . Smoking status: Current Every Day Smoker  . Smokeless tobacco: Never Used  Substance and Sexual Activity  . Alcohol use: Yes    Comment: social  . Drug use: Yes    Types: Marijuana  . Sexual activity: Yes    Birth control/protection: IUD  Other Topics Concern  . None  Social History Narrative  . None    Vitals:   12/08/17 1455  BP: 120/82  Pulse: 96  Resp: 12  Temp: 98.1 F (36.7 C)  SpO2: 98%   Body mass index is 49.49 kg/m.  Wt Readings from Last 3 Encounters:  12/08/17 279 lb 6 oz (126.7 kg)  11/17/17 273 lb 4 oz (123.9 kg)  03/16/17 276 lb (125.2 kg)     Physical Exam  Nursing note and vitals reviewed. Constitutional: She is oriented to person, place, and time. She appears well-developed. She does not appear ill. No distress.  HENT:  Head: Normocephalic and atraumatic.  Right Ear: Tympanic membrane, external ear and ear canal normal.  Left Ear: External ear and ear canal normal. Tympanic membrane is not erythematous and not  bulging. A middle ear effusion (mild) is present.  Mouth/Throat: Oropharynx is clear and moist and mucous membranes are normal.  Nasal voice. Hypertrophic turbinates.  Eyes: Conjunctivae are normal. Pupils are equal, round, and reactive to light.  Cardiovascular: Normal rate and regular rhythm.  No murmur heard. Respiratory: Effort normal and breath sounds normal. No respiratory distress.  GI: Soft. She exhibits no mass. There is no tenderness.  Musculoskeletal: She exhibits no edema.  Lymphadenopathy:    She has no cervical adenopathy.  Neurological: She is alert and oriented to person, place, and time. She has normal strength. Gait normal.  Skin: Skin is warm. No rash noted. No erythema.  Psychiatric: Her mood appears anxious.  Well groomed, good eye contact.    ASSESSMENT AND PLAN:  Ms. Lowanda FosterBrittany was seen today for follow-up.  Orders Placed This Encounter  Procedures  . Pulmonary Function Test    Wheezing symptom  Possible etiologies discussed, including asthma, COPD,GERD, obesity ([facility]) among some. Strongly recommend smoking cessation. Flovent HFA added. Continue Albuterol inh prn. PFT will be arranged.  -     fluticasone (FLOVENT HFA) 110 MCG/ACT inhaler; Inhale 2 puffs into the lungs 2 (two) times daily. -     Pulmonary Function Test; Future   Morbid obesity with body mass index (BMI) of 40.0 to 49.9 (HCC) We discussed benefits of wt loss as well as adverse effects of obesity. Consistency with healthy diet and physical activity recommended.    GERD (gastroesophageal reflux disease) Improved. GERD precautions discussed. Weight loss will help. No changes seen Protonix 40 mg daily.   TOBACCO ABUSE Adverse effects of tobacco use discussed. She is not interested in smoking cessation, she does not think the amount of cigarettes she smokes per day will cause respiratory symptoms.   Earache symptoms in both ears  Examination today negative except for mild middle  ear effusion left ear. ? Eustachian tube dysfunction. Recommend auto inflation maneuvers. Instructed about warning signs.     Betty G. [place], MD  [facility], [facility]. [place] office.

## 2017-12-08 ENCOUNTER — Ambulatory Visit: Payer: BLUE CROSS/BLUE SHIELD | Admitting: Family Medicine

## 2017-12-08 ENCOUNTER — Encounter: Payer: Self-pay | Admitting: Family Medicine

## 2017-12-08 VITALS — BP 120/82 | HR 96 | Temp 98.1°F | Resp 12 | Ht 63.0 in | Wt 279.4 lb

## 2017-12-08 DIAGNOSIS — R062 Wheezing: Secondary | ICD-10-CM

## 2017-12-08 DIAGNOSIS — H9203 Otalgia, bilateral: Secondary | ICD-10-CM | POA: Diagnosis not present

## 2017-12-08 DIAGNOSIS — F17200 Nicotine dependence, unspecified, uncomplicated: Secondary | ICD-10-CM | POA: Diagnosis not present

## 2017-12-08 DIAGNOSIS — K219 Gastro-esophageal reflux disease without esophagitis: Secondary | ICD-10-CM | POA: Diagnosis not present

## 2017-12-08 MED ORDER — FLUTICASONE PROPIONATE HFA 110 MCG/ACT IN AERO: 2.0000 | INHALATION_SPRAY | Freq: Two times a day (BID) | RESPIRATORY_TRACT | 3 refills | Status: DC

## 2017-12-08 NOTE — Assessment & Plan Note (Signed)
Adverse effects of tobacco use discussed. She is not interested in smoking cessation, she does not think the amount of cigarettes she smokes per day will cause respiratory symptoms.

## 2017-12-08 NOTE — Assessment & Plan Note (Signed)
Improved. GERD precautions discussed. Weight loss will help. No changes seen Protonix 40 mg daily.

## 2017-12-08 NOTE — Assessment & Plan Note (Signed)
We discussed benefits of wt loss as well as adverse effects of obesity. Consistency with healthy diet and physical activity recommended.  

## 2017-12-08 NOTE — Patient Instructions (Addendum)
A few things to remember from today's visit:   Gastroesophageal reflux disease, esophagitis presence not specified  Wheezing symptom - Plan: fluticasone (FLOVENT HFA) 110 MCG/ACT inhaler, Pulmonary Function Test  Flovent 2 times daily added. Continue Albuterol as needed.  No changes on Protonix.   Tobacco Use Disorder Tobacco use disorder (TUD) is a mental disorder. It is the long-term use of tobacco in spite of related health problems or difficulty with normal life activities. Tobacco is most commonly smoked as cigarettes and less commonly as cigars or pipes. Smokeless chewing tobacco and snuff are also popular. People with TUD get a feeling of extreme pleasure (euphoria) from using tobacco and have a desire to use it again and again. Repeated use of tobacco can cause problems. The addictive effects of tobacco are due mainly tothe ingredient nicotine. Nicotine also causes a rush of adrenaline (epinephrine) in the body. This leads to increased blood pressure, heart rate, and breathing rate. These changes may cause problems for people with high blood pressure, weak hearts, or lung disease. High doses of nicotine in children and pets can lead to seizures and death. Tobacco contains a number of other unsafe chemicals. These chemicals are especially harmful when inhaled as smoke and can damage almost every organ in the body. Smokers live shorter lives than nonsmokers and are at risk of dying from a number of diseases and cancers. Tobacco smoke can also cause health problems for nonsmokers (due to inhaling secondhand smoke). Smoking is also a fire hazard. TUD usually starts in the late teenage years and is most common in young adults between the ages of 31 and 25 years. People who start smoking earlier in life are more likely to continue smoking as adults. TUD is somewhat more common in men than women. People with TUD are at higher risk for using alcohol and other drugs of abuse. What increases the  risk? Risk factors for TUD include:  Having family members with the disorder.  Being around people who use tobacco.  Having an existing mental health issue such as schizophrenia, depression, bipolar disorder, ADHD, or posttraumatic stress disorder (PTSD).  What are the signs or symptoms? People with tobacco use disorder have two or more of the following signs and symptoms within 12 months:  Use of more tobacco over a longer period than intended.  Not able to cut down or control tobacco use.  A lot of time spent obtaining or using tobacco.  Strong desire or urge to use tobacco (craving). Cravings may last for 6 months or longer after quitting.  Use of tobacco even when use leads to major problems at work, school, or home.  Use of tobacco even when use leads to relationship problems.  Giving up or cutting down on important life activities because of tobacco use.  Repeatedly using tobacco in situations where it puts you or others in physical danger, like smoking in bed.  Use of tobacco even when it is known that a physical or mental problem is likely related to tobacco use. ? Physical problems are numerous and may include chronic bronchitis, emphysema, lung and other cancers, gum disease, high blood pressure, heart disease, and stroke. ? Mental problems caused by tobacco may include difficulty sleeping and anxiety.  Need to use greater amounts of tobacco to get the same effect. This means you have developed a tolerance.  Withdrawal symptoms as a result of stopping or rapidly cutting back use. These symptoms may last a month or more after quitting and include  the following: ? Depressed, anxious, or irritable mood. ? Difficulty concentrating. ? Increased appetite. ? Restlessness or trouble sleeping. ? Use of tobacco to avoid withdrawal symptoms.  How is this diagnosed? Tobacco use disorder is diagnosed by your health care provider. A diagnosis may be made by:  Your health care  provider asking questions about your tobacco use and any problems it may be causing.  A physical exam.  Lab tests.  You may be referred to a mental health professional or addiction specialist.  The severity of tobacco use disorder depends on the number of signs and symptoms you have:  Mild-Two or three symptoms.  Moderate-Four or five symptoms.  Severe-Six or more symptoms.  How is this treated? Many people with tobacco use disorder are unable to quit on their own and need help. Treatment options include the following:  Nicotine replacement therapy (NRT). NRT provides nicotine without the other harmful chemicals in tobacco. NRT gradually lowers the dosage of nicotine in the body and reduces withdrawal symptoms. NRT is available in over-the-counter forms (gum, lozenges, and skin patches) as well as prescription forms (mouth inhaler and nasal spray).  Medicines.This may include: ? Antidepressant medicine that may reduce nicotine cravings. ? A medicine that acts on nicotine receptors in the brain to reduce cravings and withdrawal symptoms. It may also block the effects of tobacco in people with TUD who relapse.  Counseling or talk therapy. A form of talk therapy called behavioral therapy is commonly used to treat people with TUD. Behavioral therapy looks at triggers for tobacco use, how to avoid them, and how to cope with cravings. It is most effective in person or by phone but is also available in self-help forms (books and Internet websites).  Support groups. These provide emotional support, advice, and guidance for quitting tobacco.  The most effective treatment for TUD is usually a combination of medicine, talk therapy, and support groups. Follow these instructions at home:  Keep all follow-up visits as directed by your health care provider. This is important.  Take medicines only as directed by your health care provider.  Check with your health care provider before starting new  prescription or over-the-counter medicines. Contact a health care provider if:  You are not able to take your medicines as prescribed.  Treatment is not helping your TUD and your symptoms get worse. Get help right away if:  You have serious thoughts about hurting yourself or others.  You have trouble breathing, chest pain, sudden weakness, or sudden numbness in part of your body. This information is not intended to replace advice given to you by your health care provider. Make sure you discuss any questions you have with your health care provider. Document Released: 05/27/2004 Document Revised: 05/24/2016 Document Reviewed: 11/17/2013 Elsevier Interactive Patient Education  [employer]2018 [employer].  Please be sure medication list is accurate. If a new problem present, please set up appointment sooner than planned today.

## 2018-01-14 DIAGNOSIS — E669 Obesity, unspecified: Secondary | ICD-10-CM | POA: Diagnosis not present

## 2018-01-14 DIAGNOSIS — E860 Dehydration: Secondary | ICD-10-CM | POA: Diagnosis not present

## 2018-01-14 DIAGNOSIS — R112 Nausea with vomiting, unspecified: Secondary | ICD-10-CM | POA: Diagnosis not present

## 2018-01-14 DIAGNOSIS — R197 Diarrhea, unspecified: Secondary | ICD-10-CM | POA: Diagnosis not present

## 2018-01-15 ENCOUNTER — Encounter (HOSPITAL_COMMUNITY): Payer: Self-pay | Admitting: Emergency Medicine

## 2018-01-15 ENCOUNTER — Other Ambulatory Visit: Payer: Self-pay

## 2018-01-15 ENCOUNTER — Emergency Department (HOSPITAL_COMMUNITY)
Admission: EM | Admit: 2018-01-15 | Discharge: 2018-01-15 | Disposition: A | Discharge status: Home / Self Care | Payer: BLUE CROSS/BLUE SHIELD | Attending: Emergency Medicine | Admitting: Emergency Medicine

## 2018-01-15 ENCOUNTER — Emergency Department (HOSPITAL_COMMUNITY): Payer: BLUE CROSS/BLUE SHIELD

## 2018-01-15 DIAGNOSIS — F17210 Nicotine dependence, cigarettes, uncomplicated: Secondary | ICD-10-CM | POA: Diagnosis not present

## 2018-01-15 DIAGNOSIS — K529 Noninfective gastroenteritis and colitis, unspecified: Secondary | ICD-10-CM | POA: Insufficient documentation

## 2018-01-15 DIAGNOSIS — R109 Unspecified abdominal pain: Secondary | ICD-10-CM | POA: Diagnosis not present

## 2018-01-15 DIAGNOSIS — R1110 Vomiting, unspecified: Secondary | ICD-10-CM | POA: Diagnosis not present

## 2018-01-15 DIAGNOSIS — R509 Fever, unspecified: Secondary | ICD-10-CM | POA: Diagnosis not present

## 2018-01-15 DIAGNOSIS — R197 Diarrhea, unspecified: Secondary | ICD-10-CM | POA: Diagnosis not present

## 2018-01-15 DIAGNOSIS — R1031 Right lower quadrant pain: Secondary | ICD-10-CM | POA: Diagnosis not present

## 2018-01-15 DIAGNOSIS — R112 Nausea with vomiting, unspecified: Secondary | ICD-10-CM | POA: Diagnosis not present

## 2018-01-15 HISTORY — DX: Gastro-esophageal reflux disease without esophagitis: K21.9

## 2018-01-15 LAB — URINALYSIS, ROUTINE W REFLEX MICROSCOPIC
BILIRUBIN URINE: NEGATIVE
Glucose, UA: NEGATIVE mg/dL
HGB URINE DIPSTICK: NEGATIVE
Ketones, ur: NEGATIVE mg/dL
LEUKOCYTES UA: NEGATIVE
NITRITE: NEGATIVE
PROTEIN: 30 mg/dL — AB
Specific Gravity, Urine: 1.031 — ABNORMAL HIGH (ref 1.005–1.030)
pH: 5 (ref 5.0–8.0)

## 2018-01-15 LAB — I-STAT CG4 LACTIC ACID, ED
LACTIC ACID, VENOUS: 1.47 mmol/L (ref 0.5–1.9)
Lactic Acid, Venous: 2.47 mmol/L (ref 0.5–1.9)

## 2018-01-15 LAB — CBC
HEMATOCRIT: 37.5 % (ref 36.0–46.0)
HEMOGLOBIN: 12.6 g/dL (ref 12.0–15.0)
MCH: 29.9 pg (ref 26.0–34.0)
MCHC: 33.6 g/dL (ref 30.0–36.0)
MCV: 89.1 fL (ref 78.0–100.0)
Platelets: 359 10*3/uL (ref 150–400)
RBC: 4.21 MIL/uL (ref 3.87–5.11)
RDW: 13.4 % (ref 11.5–15.5)
WBC: 9.1 10*3/uL (ref 4.0–10.5)

## 2018-01-15 LAB — COMPREHENSIVE METABOLIC PANEL
ALBUMIN: 3.8 g/dL (ref 3.5–5.0)
ALT: 20 U/L (ref 14–54)
ANION GAP: 10 (ref 5–15)
AST: 25 U/L (ref 15–41)
Alkaline Phosphatase: 78 U/L (ref 38–126)
BILIRUBIN TOTAL: 1 mg/dL (ref 0.3–1.2)
BUN: 9 mg/dL (ref 6–20)
CO2: 24 mmol/L (ref 22–32)
Calcium: 9.1 mg/dL (ref 8.9–10.3)
Chloride: 101 mmol/L (ref 101–111)
Creatinine, Ser: 0.79 mg/dL (ref 0.44–1.00)
GFR calc Af Amer: >60 mL/min (ref >60)
Glucose, Bld: 118 mg/dL — ABNORMAL HIGH (ref 65–99)
POTASSIUM: 3.8 mmol/L (ref 3.5–5.1)
Sodium: 135 mmol/L (ref 135–145)
Total Protein: 7.2 g/dL (ref 6.5–8.1)

## 2018-01-15 LAB — I-STAT BETA HCG BLOOD, ED (MC, WL, AP ONLY)

## 2018-01-15 LAB — LIPASE, BLOOD: LIPASE: 30 U/L (ref 11–51)

## 2018-01-15 MED ORDER — ONDANSETRON HCL 4 MG/2ML IJ SOLN
4.0000 mg | Freq: Once | INTRAMUSCULAR | Status: AC
  Administered 2018-01-15: 4 mg via INTRAVENOUS
  Filled 2018-01-15: qty 2

## 2018-01-15 MED ORDER — IOPAMIDOL (ISOVUE-300) INJECTION 61%
INTRAVENOUS | Status: AC
  Filled 2018-01-15: qty 100

## 2018-01-15 MED ORDER — ONDANSETRON 4 MG PO TBDP
4.0000 mg | ORAL_TABLET | Freq: Once | ORAL | Status: AC | PRN
  Administered 2018-01-15: 4 mg via ORAL
  Filled 2018-01-15: qty 1

## 2018-01-15 MED ORDER — FENTANYL CITRATE (PF) 100 MCG/2ML IJ SOLN
50.0000 ug | Freq: Once | INTRAMUSCULAR | Status: AC
  Administered 2018-01-15: 50 ug via INTRAVENOUS
  Filled 2018-01-15: qty 2

## 2018-01-15 MED ORDER — SODIUM CHLORIDE 0.9 % IV BOLUS
1000.0000 mL | Freq: Once | INTRAVENOUS | Status: AC
  Administered 2018-01-15: 1000 mL via INTRAVENOUS

## 2018-01-15 MED ORDER — ACETAMINOPHEN 325 MG PO TABS
650.0000 mg | ORAL_TABLET | Freq: Once | ORAL | Status: AC | PRN
  Administered 2018-01-15: 650 mg via ORAL
  Filled 2018-01-15: qty 2

## 2018-01-15 MED ORDER — ONDANSETRON 4 MG PO TBDP: 4.0000 mg | ORAL_TABLET | Freq: Three times a day (TID) | ORAL | 0 refills | Status: DC | PRN

## 2018-01-15 MED ORDER — IOPAMIDOL (ISOVUE-300) INJECTION 61%
100.0000 mL | Freq: Once | INTRAVENOUS | Status: AC | PRN
  Administered 2018-01-15: 100 mL via INTRAVENOUS

## 2018-01-15 NOTE — ED Notes (Signed)
Pt stable, ambulatory, and verbalizes understanding of d/c instructions.  

## 2018-01-15 NOTE — ED Notes (Signed)
Pt given a sprite 

## 2018-01-15 NOTE — ED Provider Notes (Signed)
Patient tolerating fluids, states that she feels better, per his report, patient appropriate for discharge.   Gerhard MunchLockwood, Jeslin Bazinet, MD 01/15/18 1041

## 2018-01-15 NOTE — Discharge Instructions (Signed)
Keep yourself hydrated. Use the nausea medication as prescribed. Follow up with your doctor. Return to the ED if you develop new or worsening symptoms.  °

## 2018-01-15 NOTE — ED Triage Notes (Signed)
C/o R sided abd pain, nausea, vomiting, and diarrhea since Friday morning at 9am.

## 2018-01-15 NOTE — ED Provider Notes (Signed)
[facility]ENC[facility] Provider Note   CSN: [id number] Arrival date & time: 01/15/18  0147     History   Chief Complaint Chief Complaint  Patient presents with  . Abdominal Pain  . Emesis  . Diarrhea    HPI Meredith Thompson Student is a 32 y.o. female.  Patient states she has had 24 hours of abdominal pain, nausea, vomiting and diarrhea.  Symptoms started around 9 AM April 12.  She thought she was initially having some acid reflux and indigestion but she has had about 12 episodes of nonbloody nonbilious emesis and 12 episodes of loose stool this nonbloody as well.  She went to urgent care and was told to come to the ED but she did not for several hours.  She denies any pain with urination or blood in the urine.  She denies any sick contacts, recent travel or antibiotic use.  No suspicious food intake.  No chest pain or shortness of breath.  Abdominal pain is mostly right-sided.  She is febrile and tachycardic in triage.   The history is provided by the patient.  Abdominal Pain   Associated symptoms include fever, diarrhea, nausea and vomiting. Pertinent negatives include dysuria, hematuria and arthralgias.  Emesis   Associated symptoms include abdominal pain, diarrhea and a fever. Pertinent negatives include no arthralgias and no cough.  Diarrhea   Associated symptoms include abdominal pain and vomiting. Pertinent negatives include no arthralgias and no cough.    Past Medical History:  Diagnosis Date  . Acid reflux   . Allergy   . Stress incontinence of urine     Patient Active Problem List   Diagnosis Date Noted  . GERD (gastroesophageal reflux disease) 11/17/2017  . Chronic pain of left knee 07/20/2016  . Chronic left-sided back pain 07/20/2016  . Chronic left-sided low back pain without sciatica 07/20/2016  . Morbid obesity with body mass index (BMI) of 40.0 to 49.9 (HCC) 05/15/2008  . TOBACCO ABUSE 05/15/2008  . DENTAL CARIES 05/15/2008  .  ELEVATED BLOOD PRESSURE WITHOUT DIAGNOSIS OF HYPERTENSION 05/15/2008    Past Surgical History:  Procedure Laterality Date  . TONSILLECTOMY AND ADENOIDECTOMY  1999     OB History   None      Home Medications    Prior to Admission medications   Medication Sig Start Date End Date Taking? Authorizing Provider  albuterol (PROVENTIL HFA;VENTOLIN HFA) 108 (90 Base) MCG/ACT inhaler Inhale 2 puffs into the lungs every 6 (six) hours as needed for wheezing or shortness of breath. 11/17/17   [place], Betty G, MD  fluticasone (FLONASE) 50 MCG/ACT nasal spray Place 1 spray into both nostrils 2 (two) times daily. 11/17/17   [place], Betty G, MD  fluticasone (FLOVENT HFA) 110 MCG/ACT inhaler Inhale 2 puffs into the lungs 2 (two) times daily. 12/08/17   [place], Betty G, MD  pantoprazole (PROTONIX) 40 MG tablet Take 1 tablet (40 mg total) by mouth daily. 11/17/17   [place], Betty G, MD    Family History Family History  Problem Relation Age of Onset  . Diabetes Mother   . Hyperlipidemia Mother   . Heart disease Mother        CHF  . Hypertension Mother   . Mental illness Mother   . Diabetes Father   . Hyperlipidemia Father   . Hypertension Father     Social History Social History   Tobacco Use  . Smoking status: Current Every Day Smoker    Types: Cigars  .  Smokeless tobacco: Never Used  Substance Use Topics  . Alcohol use: Yes    Comment: social  . Drug use: Yes    Types: Marijuana     Allergies   Patient has no known allergies.   Review of Systems Review of Systems  Constitutional: Positive for activity change, appetite change and fever.  HENT: Negative for congestion.   Respiratory: Negative for cough, chest tightness and shortness of breath.   Gastrointestinal: Positive for abdominal pain, diarrhea, nausea and vomiting.  Genitourinary: Negative for dysuria, hematuria, vaginal bleeding and vaginal discharge.  Musculoskeletal: Negative for arthralgias.  Skin: Negative for rash.    Neurological: Negative for dizziness, weakness, light-headedness and numbness.    all other systems are negative except as noted in the HPI and PMH.    Physical Exam Updated Vital Signs BP (!) 144/85 (BP Location: Right Arm)   Pulse (!) 135   Temp (!) 102.2 F (39 C) (Oral)   Resp 20   SpO2 99%   Physical Exam  Constitutional: She is oriented to person, place, and time. She appears well-developed and well-nourished. She appears ill. No distress.  obese  HENT:  Head: Normocephalic and atraumatic.  Mouth/Throat: Oropharynx is clear and moist. No oropharyngeal exudate.  Eyes: Pupils are equal, round, and reactive to light. Conjunctivae and EOM are normal.  Neck: Normal range of motion. Neck supple.  No meningismus.  Cardiovascular: Normal rate, normal heart sounds and intact distal pulses.  No murmur heard. tachycardic  Pulmonary/Chest: Effort normal and breath sounds normal. No respiratory distress.  Abdominal: Soft. There is tenderness. There is guarding. There is no rebound.  Obese abdomen, tender to nose throughout, worse in the right lower quadrant with voluntary guarding  Musculoskeletal: Normal range of motion. She exhibits no edema or tenderness.  No CVAT  Neurological: She is alert and oriented to person, place, and time. No cranial nerve deficit. She exhibits normal muscle tone. Coordination normal.  No ataxia on finger to nose bilaterally. No pronator drift. 5/5 strength throughout. CN 2-12 intact.Equal grip strength. Sensation intact.   Skin: Skin is warm.  Psychiatric: She has a normal mood and affect. Her behavior is normal.  Nursing note and vitals reviewed.    ED Treatments / Results  Labs (all labs ordered are listed, but only abnormal results are displayed) Labs Reviewed  COMPREHENSIVE METABOLIC PANEL - Abnormal; Notable for the following components:      Result Value   Glucose, Bld 118 (*)    All other components within normal limits  URINALYSIS,  ROUTINE W REFLEX MICROSCOPIC - Abnormal; Notable for the following components:   APPearance HAZY (*)    Specific Gravity, Urine 1.031 (*)    Protein, ur 30 (*)    Bacteria, UA RARE (*)    Squamous Epithelial / LPF 6-30 (*)    All other components within normal limits  I-STAT CG4 LACTIC ACID, ED - Abnormal; Notable for the following components:   Lactic Acid, Venous 2.47 (*)    All other components within normal limits  CULTURE, BLOOD (ROUTINE X 2)  CULTURE, BLOOD (ROUTINE X 2)  LIPASE, BLOOD  CBC  I-STAT BETA HCG BLOOD, ED ([facility], [facility], [facility] ONLY)  I-STAT CG4 LACTIC ACID, ED    EKG None  Radiology Ct Abdomen Pelvis W Contrast  Result Date: 01/15/2018 CLINICAL DATA:  Right-sided abdominal pain. Nausea, vomiting and diarrhea. EXAM: CT ABDOMEN AND PELVIS WITH CONTRAST TECHNIQUE: Multidetector CT imaging of the abdomen and pelvis was performed  using the standard protocol following bolus administration of intravenous contrast. CONTRAST:  ISOVUE-300 IOPAMIDOL (ISOVUE-300) INJECTION 61% COMPARISON:  None. FINDINGS: Lower chest: The lung bases are clear. Hepatobiliary: Decreased hepatic density consistent with steatosis. Mild focal fatty sparing adjacent to the gallbladder fossa. Gallbladder is contracted without gallstone or pericholecystic inflammation. Pancreas: No ductal dilatation or inflammation. Spleen: Normal in size without focal abnormality. Adrenals/Urinary Tract: Normal adrenal glands. No hydronephrosis or perinephric edema. Homogeneous renal enhancement. Urinary bladder is nondistended without wall thickening. Stomach/Bowel: Normal appendix best appreciated on coronal 69 through 77. Small hiatal hernia. Stomach is nondistended. No bowel obstruction or inflammatory change. No evidence of bowel wall thickening. Distal small bowel are fluid-filled. Fluid/liquid stool in the colon without colonic inflammatory change. Vascular/Lymphatic: Prominent ileocolic lymph nodes are likely reactive. No  acute vascular finding. No pelvic adenopathy. Reproductive: IUD appropriately positioned in the uterus. No adnexal mass. Other: No free air or ascites. Tiny fat containing umbilical hernia. Musculoskeletal: There are no acute or suspicious osseous abnormalities. IMPRESSION: 1. Fluid-filled distal small bowel and colon suggesting enteritis/diarrheal process. No bowel inflammation or obstruction. 2. Normal appendix. 3. Hepatic steatosis. 4. Small hiatal hernia. Electronically Signed   By: Rubye Oaks M.D.   On: 01/15/2018 06:01    Procedures Procedures (including critical care time)  Medications Ordered in ED Medications  sodium chloride 0.9 % bolus 1,000 mL (has no administration in time range)  ondansetron (ZOFRAN) injection 4 mg (has no administration in time range)  fentaNYL (SUBLIMAZE) injection 50 mcg (has no administration in time range)  acetaminophen (TYLENOL) tablet 650 mg (650 mg Oral Given 01/15/18 0215)  ondansetron (ZOFRAN-ODT) disintegrating tablet 4 mg (4 mg Oral Given 01/15/18 0215)     Initial Impression / Assessment and Plan / ED Course  I have reviewed the triage vital signs and the nursing notes.  Pertinent labs & imaging results that were available during my care of the patient were reviewed by me and considered in my medical decision making (see chart for details).    Patient with a 24-hour history of nausea, vomiting, diarrhea and abdominal pain.  She is febrile and tachycardic on arrival.  Majority of pain is in the right side of her abdomen.  We will give symptom control, hydration, evaluate for appendicitis.  Patient given IV fluids and symptom control.  No leukocytosis.  She is febrile in the ED.  CT scan shows normal appendix.  Suspect viral gastroenteritis causing her fever, nausea and vomiting.  She remains tachycardic.  We will give additional hydration and attempt p.o. Challenge. Dr. Jeraldine Loots to assume care at shift change.  Final Clinical  Impressions(s) / ED Diagnoses   Final diagnoses:  Gastroenteritis    ED Discharge Orders    None       Earnest Thalman, Jeannett Senior, MD 01/15/18 [id number]

## 2018-01-15 NOTE — ED Notes (Signed)
Ambulated pt too restroom with no difficulties.

## 2018-01-15 NOTE — ED Notes (Signed)
Pt given an additional sprite, tolerating well.

## 2018-01-16 ENCOUNTER — Telehealth (HOSPITAL_BASED_OUTPATIENT_CLINIC_OR_DEPARTMENT_OTHER): Payer: Self-pay | Admitting: Emergency Medicine

## 2018-01-16 LAB — BLOOD CULTURE ID PANEL (REFLEXED)
ACINETOBACTER BAUMANNII: NOT DETECTED
CANDIDA GLABRATA: NOT DETECTED
CANDIDA KRUSEI: NOT DETECTED
CANDIDA PARAPSILOSIS: NOT DETECTED
Candida albicans: NOT DETECTED
Candida tropicalis: NOT DETECTED
ESCHERICHIA COLI: NOT DETECTED
Enterobacter cloacae complex: NOT DETECTED
Enterobacteriaceae species: NOT DETECTED
Enterococcus species: NOT DETECTED
Haemophilus influenzae: NOT DETECTED
KLEBSIELLA OXYTOCA: NOT DETECTED
Klebsiella pneumoniae: NOT DETECTED
Listeria monocytogenes: NOT DETECTED
Methicillin resistance: DETECTED — AB
Neisseria meningitidis: NOT DETECTED
PSEUDOMONAS AERUGINOSA: NOT DETECTED
Proteus species: NOT DETECTED
SERRATIA MARCESCENS: NOT DETECTED
STAPHYLOCOCCUS AUREUS BCID: NOT DETECTED
STREPTOCOCCUS PNEUMONIAE: NOT DETECTED
STREPTOCOCCUS PYOGENES: NOT DETECTED
Staphylococcus species: DETECTED — AB
Streptococcus agalactiae: NOT DETECTED
Streptococcus species: NOT DETECTED

## 2018-01-17 LAB — CULTURE, BLOOD (ROUTINE X 2): Special Requests: ADEQUATE

## 2018-01-18 ENCOUNTER — Telehealth: Payer: Self-pay | Admitting: *Deleted

## 2018-01-18 NOTE — Progress Notes (Signed)
ED Antimicrobial Stewardship Positive Culture Follow Up   Meredith Thompson is an 32 y.o. female who presented to [facility] on 01/15/2018 with a chief complaint of  Chief Complaint  Patient presents with  . Abdominal Pain  . Emesis  . Diarrhea    Recent Results (from the past 720 hour(s))  Blood culture (routine x 2)     Status: Abnormal   Collection Time: 01/15/18  5:14 AM  Result Value Ref Range Status   Specimen Description BLOOD LEFT ANTECUBITAL  Final   Special Requests   Final    BOTTLES DRAWN AEROBIC AND ANAEROBIC Blood Culture adequate volume   Culture  Setup Time   Final    GRAM POSITIVE COCCI IN CLUSTERS AEROBIC BOTTLE ONLY CRITICAL RESULT CALLED TO, READ BACK BY AND VERIFIED WITH: C REED,RN AT 1135 01/16/18 BY L BENFIELD    Culture (A)  Final    STAPHYLOCOCCUS SPECIES (COAGULASE NEGATIVE) THE SIGNIFICANCE OF ISOLATING THIS ORGANISM FROM A SINGLE SET OF BLOOD CULTURES WHEN MULTIPLE SETS ARE DRAWN IS UNCERTAIN. PLEASE NOTIFY THE MICROBIOLOGY DEPARTMENT WITHIN ONE WEEK IF SPECIATION AND SENSITIVITIES ARE REQUIRED. Performed at [facility], [address], [place], [place] [address]    Report Status 01/17/2018 FINAL  Final  Blood Culture ID Panel (Reflexed)     Status: Abnormal   Collection Time: 01/15/18  5:14 AM  Result Value Ref Range Status   Enterococcus species NOT DETECTED NOT DETECTED Final   Listeria monocytogenes NOT DETECTED NOT DETECTED Final   Staphylococcus species DETECTED (A) NOT DETECTED Final    Comment: Methicillin (oxacillin) resistant coagulase negative staphylococcus. Possible blood culture contaminant (unless isolated from more than one blood culture draw or clinical case suggests pathogenicity). No antibiotic treatment is indicated for blood  culture contaminants. CRITICAL RESULT CALLED TO, READ BACK BY AND VERIFIED WITH: C REED,RN AT 1135 01/16/18 BY L BENFIELD    Staphylococcus aureus NOT DETECTED NOT DETECTED Final   Methicillin  resistance DETECTED (A) NOT DETECTED Final    Comment: CRITICAL RESULT CALLED TO, READ BACK BY AND VERIFIED WITH: C REED,RN AT 1135 01/16/18 BY L BENFIELD    Streptococcus species NOT DETECTED NOT DETECTED Final   Streptococcus agalactiae NOT DETECTED NOT DETECTED Final   Streptococcus pneumoniae NOT DETECTED NOT DETECTED Final   Streptococcus pyogenes NOT DETECTED NOT DETECTED Final   Acinetobacter baumannii NOT DETECTED NOT DETECTED Final   Enterobacteriaceae species NOT DETECTED NOT DETECTED Final   Enterobacter cloacae complex NOT DETECTED NOT DETECTED Final   Escherichia coli NOT DETECTED NOT DETECTED Final   Klebsiella oxytoca NOT DETECTED NOT DETECTED Final   Klebsiella pneumoniae NOT DETECTED NOT DETECTED Final   Proteus species NOT DETECTED NOT DETECTED Final   Serratia marcescens NOT DETECTED NOT DETECTED Final   Haemophilus influenzae NOT DETECTED NOT DETECTED Final   Neisseria meningitidis NOT DETECTED NOT DETECTED Final   Pseudomonas aeruginosa NOT DETECTED NOT DETECTED Final   Candida albicans NOT DETECTED NOT DETECTED Final   Candida glabrata NOT DETECTED NOT DETECTED Final   Candida krusei NOT DETECTED NOT DETECTED Final   Candida parapsilosis NOT DETECTED NOT DETECTED Final   Candida tropicalis NOT DETECTED NOT DETECTED Final    Comment: Performed at [facility], [address], [place], [place] [address]  Blood culture (routine x 2)     Status: None (Preliminary result)   Collection Time: 01/15/18  9:02 AM  Result Value Ref Range Status   Specimen Description  BLOOD LEFT ANTECUBITAL  Final   Special Requests   Final    BOTTLES DRAWN AEROBIC AND ANAEROBIC Blood Culture results may not be optimal due to an excessive volume of blood received in culture bottles   Culture   Final    NO GROWTH 2 DAYS Performed at [facility], [address] [place], [place] [address]    Report Status PENDING  Incomplete   Pt discharged after receiving fluids with  suspected viral gastroenteritis. Blood culture growth likely contaminant, and no antibiotic therapy indicated.  ED Provider: Dietrich PatesHina Khatri PA-C   Anselm PancoastAmy Keiondra Brookover, PharmD Candidate 01/18/2018, 8:59 AM Phone# [phone]

## 2018-01-18 NOTE — Telephone Encounter (Signed)
Post ED Visit - Positive Culture Follow-up  Culture report reviewed by antimicrobial stewardship pharmacist:  []  Enzo BiNathan Batchelder, Pharm.D. []  Celedonio MiyamotoJeremy Frens, Pharm.D., BCPS AQ-ID []  Garvin FilaMike Maccia, Pharm.D., BCPS []  Georgina PillionElizabeth Martin, Pharm.D., BCPS []  EmporiaMinh Pham, [address]Pharm[address]D., BCPS, AAHIVP [x]  Estella HuskMichelle Turner, Pharm.D., BCPS, AAHIVP []  Lysle Pearlachel Rumbarger, PharmD, BCPS []  Blake DivineShannon Parkey, PharmD []  Pollyann SamplesAndy Johnston, PharmD, BCPS  Positive blood culture Likely contaminant and no further patient follow-up is required at this time.  Virl AxeRobertson, Shanise Balch [facility] 01/18/2018, 9:38 AM

## 2018-01-20 LAB — CULTURE, BLOOD (ROUTINE X 2): Culture: NO GROWTH

## 2018-03-16 ENCOUNTER — Other Ambulatory Visit: Payer: Self-pay | Admitting: Family Medicine

## 2018-03-16 DIAGNOSIS — K219 Gastro-esophageal reflux disease without esophagitis: Secondary | ICD-10-CM

## 2018-04-21 ENCOUNTER — Ambulatory Visit: Payer: BLUE CROSS/BLUE SHIELD | Admitting: Adult Health

## 2018-04-21 DIAGNOSIS — Z0289 Encounter for other administrative examinations: Secondary | ICD-10-CM

## 2018-05-02 ENCOUNTER — Ambulatory Visit: Payer: BLUE CROSS/BLUE SHIELD | Admitting: Family Medicine

## 2021-08-18 DIAGNOSIS — I10 Essential (primary) hypertension: Secondary | ICD-10-CM | POA: Diagnosis present

## 2022-03-05 DIAGNOSIS — F413 Other mixed anxiety disorders: Secondary | ICD-10-CM | POA: Diagnosis not present

## 2022-03-05 DIAGNOSIS — R69 Illness, unspecified: Secondary | ICD-10-CM | POA: Diagnosis not present

## 2022-03-11 ENCOUNTER — Emergency Department (HOSPITAL_COMMUNITY): Payer: 59

## 2022-03-11 ENCOUNTER — Other Ambulatory Visit: Payer: Self-pay

## 2022-03-11 ENCOUNTER — Encounter (HOSPITAL_COMMUNITY): Payer: Self-pay

## 2022-03-11 ENCOUNTER — Inpatient Hospital Stay (HOSPITAL_COMMUNITY)
Admission: EM | Admit: 2022-03-11 | Discharge: 2022-03-14 | DRG: 638 | Disposition: A | Discharge status: Home / Self Care | Payer: 59 | Attending: Family Medicine | Admitting: Family Medicine

## 2022-03-11 DIAGNOSIS — Z833 Family history of diabetes mellitus: Secondary | ICD-10-CM

## 2022-03-11 DIAGNOSIS — Z6841 Body Mass Index (BMI) 40.0 and over, adult: Secondary | ICD-10-CM

## 2022-03-11 DIAGNOSIS — T383X6A Underdosing of insulin and oral hypoglycemic [antidiabetic] drugs, initial encounter: Secondary | ICD-10-CM | POA: Diagnosis present

## 2022-03-11 DIAGNOSIS — Z87891 Personal history of nicotine dependence: Secondary | ICD-10-CM

## 2022-03-11 DIAGNOSIS — R112 Nausea with vomiting, unspecified: Secondary | ICD-10-CM | POA: Diagnosis not present

## 2022-03-11 DIAGNOSIS — Z888 Allergy status to other drugs, medicaments and biological substances status: Secondary | ICD-10-CM

## 2022-03-11 DIAGNOSIS — K219 Gastro-esophageal reflux disease without esophagitis: Secondary | ICD-10-CM | POA: Diagnosis present

## 2022-03-11 DIAGNOSIS — Z83438 Family history of other disorder of lipoprotein metabolism and other lipidemia: Secondary | ICD-10-CM

## 2022-03-11 DIAGNOSIS — R109 Unspecified abdominal pain: Secondary | ICD-10-CM | POA: Diagnosis not present

## 2022-03-11 DIAGNOSIS — M79604 Pain in right leg: Secondary | ICD-10-CM | POA: Diagnosis not present

## 2022-03-11 DIAGNOSIS — Z79899 Other long term (current) drug therapy: Secondary | ICD-10-CM

## 2022-03-11 DIAGNOSIS — E1100 Type 2 diabetes mellitus with hyperosmolarity without nonketotic hyperglycemic-hyperosmolar coma (NKHHC): Principal | ICD-10-CM | POA: Diagnosis present

## 2022-03-11 DIAGNOSIS — R739 Hyperglycemia, unspecified: Principal | ICD-10-CM | POA: Diagnosis present

## 2022-03-11 DIAGNOSIS — Z818 Family history of other mental and behavioral disorders: Secondary | ICD-10-CM

## 2022-03-11 DIAGNOSIS — R1084 Generalized abdominal pain: Secondary | ICD-10-CM | POA: Diagnosis not present

## 2022-03-11 DIAGNOSIS — Z91128 Patient's intentional underdosing of medication regimen for other reason: Secondary | ICD-10-CM

## 2022-03-11 DIAGNOSIS — R319 Hematuria, unspecified: Secondary | ICD-10-CM | POA: Diagnosis not present

## 2022-03-11 DIAGNOSIS — Z975 Presence of (intrauterine) contraceptive device: Secondary | ICD-10-CM

## 2022-03-11 DIAGNOSIS — R079 Chest pain, unspecified: Secondary | ICD-10-CM | POA: Diagnosis not present

## 2022-03-11 DIAGNOSIS — M79605 Pain in left leg: Secondary | ICD-10-CM | POA: Diagnosis not present

## 2022-03-11 DIAGNOSIS — Z8249 Family history of ischemic heart disease and other diseases of the circulatory system: Secondary | ICD-10-CM

## 2022-03-11 DIAGNOSIS — E11649 Type 2 diabetes mellitus with hypoglycemia without coma: Secondary | ICD-10-CM | POA: Diagnosis not present

## 2022-03-11 DIAGNOSIS — I10 Essential (primary) hypertension: Secondary | ICD-10-CM | POA: Diagnosis present

## 2022-03-11 DIAGNOSIS — Y92009 Unspecified place in unspecified non-institutional (private) residence as the place of occurrence of the external cause: Secondary | ICD-10-CM

## 2022-03-11 HISTORY — DX: Type 2 diabetes mellitus without complications: E11.9

## 2022-03-11 LAB — CBC WITH DIFFERENTIAL/PLATELET
Abs Immature Granulocytes: 0.05 10*3/uL (ref 0.00–0.07)
Basophils Absolute: 0 10*3/uL (ref 0.0–0.1)
Basophils Relative: 1 %
Eosinophils Absolute: 0.1 10*3/uL (ref 0.0–0.5)
Eosinophils Relative: 1 %
HCT: 41.9 % (ref 36.0–46.0)
Hemoglobin: 14.1 g/dL (ref 12.0–15.0)
Immature Granulocytes: 1 %
Lymphocytes Relative: 32 %
Lymphs Abs: 2.1 10*3/uL (ref 0.7–4.0)
MCH: 30.6 pg (ref 26.0–34.0)
MCHC: 33.7 g/dL (ref 30.0–36.0)
MCV: 90.9 fL (ref 80.0–100.0)
Monocytes Absolute: 0.5 10*3/uL (ref 0.1–1.0)
Monocytes Relative: 7 %
Neutro Abs: 4 10*3/uL (ref 1.7–7.7)
Neutrophils Relative %: 58 %
Platelets: 375 10*3/uL (ref 150–400)
RBC: 4.61 MIL/uL (ref 3.87–5.11)
RDW: 13.1 % (ref 11.5–15.5)
WBC: 6.7 10*3/uL (ref 4.0–10.5)
nRBC: 0 % (ref 0.0–0.2)

## 2022-03-11 LAB — BASIC METABOLIC PANEL
Anion gap: 16 — ABNORMAL HIGH (ref 5–15)
Anion gap: 15 (ref 5–15)
BUN: 14 mg/dL (ref 6–20)
BUN: 14 mg/dL (ref 6–20)
CO2: 20 mmol/L — ABNORMAL LOW (ref 22–32)
CO2: 23 mmol/L (ref 22–32)
Calcium: 9.4 mg/dL (ref 8.9–10.3)
Calcium: 9.9 mg/dL (ref 8.9–10.3)
Chloride: 94 mmol/L — ABNORMAL LOW (ref 98–111)
Chloride: 96 mmol/L — ABNORMAL LOW (ref 98–111)
Creatinine, Ser: 1.23 mg/dL — ABNORMAL HIGH (ref 0.44–1.00)
Creatinine, Ser: 0.94 mg/dL (ref 0.44–1.00)
GFR, Estimated: 58 mL/min — ABNORMAL LOW (ref >60)
GFR, Estimated: >60 mL/min (ref >60)
Glucose, Bld: 715 mg/dL (ref 70–99)
Glucose, Bld: 588 mg/dL (ref 70–99)
Potassium: 6.9 mmol/L (ref 3.5–5.1)
Potassium: 5.9 mmol/L — ABNORMAL HIGH (ref 3.5–5.1)
Sodium: 130 mmol/L — ABNORMAL LOW (ref 135–145)
Sodium: 134 mmol/L — ABNORMAL LOW (ref 135–145)

## 2022-03-11 LAB — URINALYSIS, ROUTINE W REFLEX MICROSCOPIC
Bacteria, UA: NONE SEEN
Bilirubin Urine: NEGATIVE
Glucose, UA: >=500 mg/dL — AB
Ketones, ur: 20 mg/dL — AB
Nitrite: NEGATIVE
Protein, ur: NEGATIVE mg/dL
RBC / HPF: >50 RBC/hpf — ABNORMAL HIGH (ref 0–5)
Specific Gravity, Urine: 1.026 (ref 1.005–1.030)
pH: 6 (ref 5.0–8.0)

## 2022-03-11 LAB — PREGNANCY, URINE: Preg Test, Ur: NEGATIVE

## 2022-03-11 LAB — CBG MONITORING, ED
Glucose-Capillary: 391 mg/dL — ABNORMAL HIGH (ref 70–99)
Glucose-Capillary: 451 mg/dL — ABNORMAL HIGH (ref 70–99)
Glucose-Capillary: 524 mg/dL (ref 70–99)
Glucose-Capillary: >600 mg/dL (ref 70–99)
Glucose-Capillary: >600 mg/dL (ref 70–99)

## 2022-03-11 LAB — BETA-HYDROXYBUTYRIC ACID: Beta-Hydroxybutyric Acid: 2.32 mmol/L — ABNORMAL HIGH (ref 0.05–0.27)

## 2022-03-11 LAB — LIPASE, BLOOD: Lipase: 51 U/L (ref 11–51)

## 2022-03-11 MED ORDER — SODIUM CHLORIDE 0.9 % IV BOLUS: 1000.0000 mL | Freq: Once | INTRAVENOUS | Status: DC

## 2022-03-11 MED ORDER — INSULIN REGULAR(HUMAN) IN NACL 100-0.9 UT/100ML-% IV SOLN
INTRAVENOUS | Status: DC
  Administered 2022-03-11: 15 [IU]/h via INTRAVENOUS
  Filled 2022-03-11: qty 100

## 2022-03-11 MED ORDER — DEXTROSE IN LACTATED RINGERS 5 % IV SOLN: INTRAVENOUS | Status: DC

## 2022-03-11 MED ORDER — LACTATED RINGERS IV BOLUS
20.0000 mL/kg | Freq: Once | INTRAVENOUS | Status: AC
  Administered 2022-03-11: 2630 mL via INTRAVENOUS

## 2022-03-11 MED ORDER — DEXTROSE 50 % IV SOLN: 0.0000 mL | INTRAVENOUS | Status: DC | PRN

## 2022-03-11 MED ORDER — ONDANSETRON HCL 4 MG/2ML IJ SOLN
4.0000 mg | Freq: Once | INTRAMUSCULAR | Status: AC
  Administered 2022-03-11: 4 mg via INTRAVENOUS
  Filled 2022-03-11: qty 2

## 2022-03-11 MED ORDER — SODIUM CHLORIDE 0.9 % IV BOLUS
1000.0000 mL | Freq: Once | INTRAVENOUS | Status: AC
  Administered 2022-03-11: 1000 mL via INTRAVENOUS

## 2022-03-11 MED ORDER — LACTATED RINGERS IV SOLN: INTRAVENOUS | Status: DC

## 2022-03-11 NOTE — ED Provider Notes (Signed)
[facility] DEPT Provider Note   CSN: [id number] Arrival date & time: 03/11/22  1857     History  Chief Complaint  Patient presents with   Hyperglycemia   Abdominal Pain   Hematuria    Meredith Thompson is a 36 y.o. female.  Patient presents hospital with chief complaint of hyperglycemia.  Patient was in outside physician who noticed her blood glucose was over 500.  Patient states that she has had blood sugars approaching 600 intermittently over the past month.  Primary care has prescribed metformin but the patient has not taken due to intolerance abdominal pain.  Attempts been made to prescribe Ozempic but insurance issues have prevented the start of this medication at this time.  Patient endorses nausea, intermittent vomiting, intermittent right-sided abdominal pain and right-sided flank pain, dysuria, hematuria.  Denies chest pain, shortness of breath, altered level of consciousness  HPI     Home Medications Prior to Admission medications   Medication Sig Start Date End Date Taking? Authorizing Provider  albuterol (PROVENTIL HFA;VENTOLIN HFA) 108 (90 Base) MCG/ACT inhaler Inhale 2 puffs into the lungs every 6 (six) hours as needed for wheezing or shortness of breath. 11/17/17   Martin[place], Betty G, MD  fluticasone (FLONASE) 50 MCG/ACT nasal spray Place 1 spray into both nostrils 2 (two) times daily. Patient taking differently: Place 1 spray into both nostrils 2 (two) times daily as needed for allergies.  11/17/17   [place], Betty G, MD  fluticasone (FLOVENT HFA) 110 MCG/ACT inhaler Inhale 2 puffs into the lungs 2 (two) times daily. Patient taking differently: Inhale 2 puffs into the lungs 2 (two) times daily as needed (SOB).  12/08/17   [place], Betty G, MD  levonorgestrel (MIRENA) 20 MCG/24HR IUD 1 each by Intrauterine route once.    [provider]  ondansetron (ZOFRAN ODT) 4 MG disintegrating tablet Take 1 tablet (4 mg total) by mouth every 8  (eight) hours as needed for nausea or vomiting. 01/15/18   Rancour, Annie Main, MD  pantoprazole (PROTONIX) 40 MG tablet Take 1 tablet (40 mg total) by mouth daily as needed (acid reflux). 03/16/18   Martin[place], Betty G, MD      Allergies    Wellbutrin [bupropion]    Review of Systems   Review of Systems  Constitutional:  Negative for fever.  Respiratory:  Negative for shortness of breath.   Cardiovascular:  Negative for chest pain.  Gastrointestinal:  Positive for abdominal pain, nausea and vomiting. Negative for constipation and diarrhea.  Endocrine: Positive for polydipsia, polyphagia and polyuria.  Genitourinary:  Positive for dysuria, flank pain and hematuria.   Physical Exam Updated Vital Signs BP (!) 136/91   Pulse (!) 103   Temp 98 F (36.7 C) (Oral)   Resp (!) 21   Ht 5\' 3"  (1.6 m)   Wt 131.5 kg   SpO2 99%   BMI 51.37 kg/m  Physical Exam Vitals and nursing note reviewed.  Constitutional:      General: She is not in acute distress.    Appearance: She is obese.  HENT:     Head: Normocephalic and atraumatic.  Eyes:     Extraocular Movements: Extraocular movements intact.  Cardiovascular:     Rate and Rhythm: Regular rhythm. Tachycardia present.     Heart sounds: Normal heart sounds.  Pulmonary:     Effort: Pulmonary effort is normal.     Breath sounds: Normal breath sounds.  Abdominal:     General: Abdomen is flat.  Palpations: Abdomen is soft.     Tenderness: There is abdominal tenderness in the right upper quadrant. There is right CVA tenderness.  Skin:    General: Skin is warm and dry.  Neurological:     Mental Status: She is alert and oriented to person, place, and time.    ED Results / Procedures / Treatments   Labs (all labs ordered are listed, but only abnormal results are displayed) Labs Reviewed  BASIC METABOLIC PANEL - Abnormal; Notable for the following components:      Result Value   Sodium 130 (*)    Potassium 6.9 (*)    Chloride 94 (*)     CO2 20 (*)    Glucose, Bld 715 (*)    Creatinine, Ser 1.23 (*)    GFR, Estimated 58 (*)    Anion gap 16 (*)    All other components within normal limits  URINALYSIS, ROUTINE W REFLEX MICROSCOPIC - Abnormal; Notable for the following components:   Color, Urine STRAW (*)    Glucose, UA >=500 (*)    Hgb urine dipstick LARGE (*)    Ketones, ur 20 (*)    Leukocytes,Ua TRACE (*)    RBC / HPF >50 (*)    All other components within normal limits  BETA-HYDROXYBUTYRIC ACID - Abnormal; Notable for the following components:   Beta-Hydroxybutyric Acid 2.32 (*)    All other components within normal limits  BASIC METABOLIC PANEL - Abnormal; Notable for the following components:   Sodium 134 (*)    Potassium 5.9 (*)    Chloride 96 (*)    Glucose, Bld 588 (*)    All other components within normal limits  CBG MONITORING, ED - Abnormal; Notable for the following components:   Glucose-Capillary >600 (*)    All other components within normal limits  CBG MONITORING, ED - Abnormal; Notable for the following components:   Glucose-Capillary >600 (*)    All other components within normal limits  CBG MONITORING, ED - Abnormal; Notable for the following components:   Glucose-Capillary 524 (*)    All other components within normal limits  CBG MONITORING, ED - Abnormal; Notable for the following components:   Glucose-Capillary 451 (*)    All other components within normal limits  CBC WITH DIFFERENTIAL/PLATELET  PREGNANCY, URINE  LIPASE, BLOOD  OSMOLALITY  BLOOD GAS, VENOUS    EKG None  Radiology CT Abdomen Pelvis Wo Contrast  Result Date: 03/11/2022 CLINICAL DATA:  Flank pain blood in urine EXAM: CT ABDOMEN AND PELVIS WITHOUT CONTRAST TECHNIQUE: Multidetector CT imaging of the abdomen and pelvis was performed following the standard protocol without IV contrast. RADIATION DOSE REDUCTION: This exam was performed according to the departmental dose-optimization program which includes automated exposure  control, adjustment of the mA and/or kV according to patient size and/or use of iterative reconstruction technique. COMPARISON:  CT 01/15/2018 FINDINGS: Lower chest: No acute abnormality. Hepatobiliary: Liver is enlarged measuring 22 cm. Diffuse hypodensity consistent with hepatic steatosis. No calcified gallstone or biliary dilatation Pancreas: Unremarkable. No pancreatic ductal dilatation or surrounding inflammatory changes. Spleen: Normal in size without focal abnormality. Adrenals/Urinary Tract: Adrenal glands are unremarkable. Kidneys are normal, without renal calculi, focal lesion, or hydronephrosis. Bladder is unremarkable. Stomach/Bowel: Stomach is within normal limits. Appendix appears normal. No evidence of bowel wall thickening, distention, or inflammatory changes. Vascular/Lymphatic: No significant vascular findings are present. No enlarged abdominal or pelvic lymph nodes. Reproductive: IUD in the uterus.  No adnexal mass Other: No abdominal wall  hernia or abnormality. No abdominopelvic ascites. Musculoskeletal: No acute or significant osseous findings. IMPRESSION: 1. No CT evidence for acute intra-abdominal or pelvic abnormality. 2. Enlarged fatty liver Electronically Signed   By: Donavan Foil M.D.   On: 03/11/2022 22:39   DG Chest 2 View  Result Date: 03/11/2022 CLINICAL DATA:  Abdomen pain EXAM: CHEST - 2 VIEW COMPARISON:  11/22/2017 FINDINGS: The heart size and mediastinal contours are within normal limits. Both lungs are clear. The visualized skeletal structures are unremarkable. IMPRESSION: No active cardiopulmonary disease. Electronically Signed   By: Donavan Foil M.D.   On: 03/11/2022 20:46    Procedures Procedures    Medications Ordered in ED Medications  insulin regular, human (MYXREDLIN) 100 units/ 100 mL infusion (10.5 Units/hr Intravenous Rate/Dose Change 03/11/22 2310)  dextrose 5 % in lactated ringers infusion (has no administration in time range)  dextrose 50 % solution 0-50  mL (has no administration in time range)  lactated ringers bolus 2,630 mL (0 mLs Intravenous Stopped 03/11/22 2231)  ondansetron (ZOFRAN) injection 4 mg (4 mg Intravenous Given 03/11/22 2047)  sodium chloride 0.9 % bolus 1,000 mL (1,000 mLs Intravenous New Bag/Given 03/11/22 2245)    ED Course/ Medical Decision Making/ A&P                           Medical Decision Making Amount and/or Complexity of Data Reviewed Labs: ordered. Radiology: ordered.  Risk Prescription drug management.   This patient presents to the ED for concern of hyperglycemia with abdominal pain, this involves an extensive number of treatment options, and is a complaint that carries with it a high risk of complications and morbidity.  The differential diagnosis includes DKA, HHS, other hyperglycemia, cholecystitis, pancreatitis, and others   Co morbidities that complicate the patient evaluation  History of type 2 diabetes, noncompliance with metformin   Additional history obtained:   External records from outside source obtained and reviewed including family medicine notes documenting previous results in care   Lab Tests:  I Ordered, and personally interpreted labs.  The pertinent results include:  initial glucose 715, follow up CBG 451, BHA 2.32, Na 134, Urinalysis with straw colored urine, >=500 glucose, large hgb, 20 ketones, trace leukocytes, grossly normal CBC   Imaging Studies ordered:  I ordered imaging studies including ct abdomen and chest x-ray  I independently visualized and interpreted imaging which showed no acute disease on chest x-ray. No CT evidence for acute intra-abdominal or pelvic abnormality I agree with the radiologist interpretation   Cardiac Monitoring: / EKG:  The patient was maintained on a cardiac monitor.  I personally viewed and interpreted the cardiac monitored which showed an underlying rhythm of: sinus tachycardia   Consultations Obtained:  I requested consultation with  the hospitalist, Dr.Chen, and discussed lab and imaging findings as well as pertinent plan - they recommend: Changing LR to NS, plan to see and admit   Problem List / ED Course / Critical interventions / Medication management   I ordered medication including LR and saline for hydration, for zofran for nausea, insulin for hyperglycemia  Reevaluation of the patient after these medicines showed that the patient improved I have reviewed the patients home medicines and have made adjustments as needed   Social Determinants of Health:  Patient with medical insurance issues keeping her from being able to afford Ozempic   Test / Admission - Considered:  Based on the patient's hyperglycemia and hyperkalemia, believe the  patient should be admitted for overnight observation. Admit to hospital         Final Clinical Impression(s) / ED Diagnoses Final diagnoses:  Hyperglycemia  Generalized abdominal pain  Hematuria, unspecified type    Rx / DC Orders ED Discharge Orders     None         Ronny Bacon 03/11/22 2327    Milton Ferguson, MD 03/20/22 [phone]

## 2022-03-11 NOTE — ED Notes (Signed)
Nurse in room getting labs, will get vitals asap

## 2022-03-11 NOTE — ED Triage Notes (Addendum)
Pt states she was at her PCP at apprx 1700 today and her CBG was over 500 and was told to come to ED. Pt c/o intermittent abdominal pain x2 weeks. Pt c/o blood in her urine x1 month off and on.

## 2022-03-12 ENCOUNTER — Encounter (HOSPITAL_COMMUNITY): Payer: Self-pay | Admitting: Internal Medicine

## 2022-03-12 DIAGNOSIS — E1100 Type 2 diabetes mellitus with hyperosmolarity without nonketotic hyperglycemic-hyperosmolar coma (NKHHC): Secondary | ICD-10-CM | POA: Diagnosis not present

## 2022-03-12 DIAGNOSIS — K219 Gastro-esophageal reflux disease without esophagitis: Secondary | ICD-10-CM | POA: Diagnosis not present

## 2022-03-12 DIAGNOSIS — Z6841 Body Mass Index (BMI) 40.0 and over, adult: Secondary | ICD-10-CM

## 2022-03-12 DIAGNOSIS — I10 Essential (primary) hypertension: Secondary | ICD-10-CM | POA: Diagnosis not present

## 2022-03-12 LAB — URINALYSIS, ROUTINE W REFLEX MICROSCOPIC
Bacteria, UA: NONE SEEN
Bilirubin Urine: NEGATIVE
Glucose, UA: >=500 mg/dL — AB
Hgb urine dipstick: NEGATIVE
Ketones, ur: 5 mg/dL — AB
Leukocytes,Ua: NEGATIVE
Nitrite: NEGATIVE
Protein, ur: NEGATIVE mg/dL
Specific Gravity, Urine: 1.028 (ref 1.005–1.030)
pH: 5 (ref 5.0–8.0)

## 2022-03-12 LAB — BLOOD GAS, VENOUS
Acid-base deficit: 0.6 mmol/L (ref 0.0–2.0)
Bicarbonate: 25.4 mmol/L (ref 20.0–28.0)
O2 Saturation: 68.4 %
Patient temperature: 37
pCO2, Ven: 46 mmHg (ref 44–60)
pH, Ven: 7.35 (ref 7.25–7.43)
pO2, Ven: 41 mmHg (ref 32–45)

## 2022-03-12 LAB — BASIC METABOLIC PANEL
Anion gap: 9 (ref 5–15)
Anion gap: 12 (ref 5–15)
Anion gap: 12 (ref 5–15)
BUN: 10 mg/dL (ref 6–20)
BUN: 11 mg/dL (ref 6–20)
BUN: 12 mg/dL (ref 6–20)
CO2: 25 mmol/L (ref 22–32)
CO2: 21 mmol/L — ABNORMAL LOW (ref 22–32)
CO2: 22 mmol/L (ref 22–32)
Calcium: 9.3 mg/dL (ref 8.9–10.3)
Calcium: 9.2 mg/dL (ref 8.9–10.3)
Calcium: 9.1 mg/dL (ref 8.9–10.3)
Chloride: 106 mmol/L (ref 98–111)
Chloride: 103 mmol/L (ref 98–111)
Chloride: 103 mmol/L (ref 98–111)
Creatinine, Ser: 0.89 mg/dL (ref 0.44–1.00)
Creatinine, Ser: 0.89 mg/dL (ref 0.44–1.00)
Creatinine, Ser: 0.99 mg/dL (ref 0.44–1.00)
GFR, Estimated: >60 mL/min (ref >60)
GFR, Estimated: >60 mL/min (ref >60)
GFR, Estimated: >60 mL/min (ref >60)
Glucose, Bld: 208 mg/dL — ABNORMAL HIGH (ref 70–99)
Glucose, Bld: 453 mg/dL — ABNORMAL HIGH (ref 70–99)
Glucose, Bld: 301 mg/dL — ABNORMAL HIGH (ref 70–99)
Potassium: 3.8 mmol/L (ref 3.5–5.1)
Potassium: 4.5 mmol/L (ref 3.5–5.1)
Potassium: 4.1 mmol/L (ref 3.5–5.1)
Sodium: 140 mmol/L (ref 135–145)
Sodium: 136 mmol/L (ref 135–145)
Sodium: 137 mmol/L (ref 135–145)

## 2022-03-12 LAB — GLUCOSE, CAPILLARY
Glucose-Capillary: 173 mg/dL — ABNORMAL HIGH (ref 70–99)
Glucose-Capillary: 175 mg/dL — ABNORMAL HIGH (ref 70–99)
Glucose-Capillary: 180 mg/dL — ABNORMAL HIGH (ref 70–99)
Glucose-Capillary: 191 mg/dL — ABNORMAL HIGH (ref 70–99)
Glucose-Capillary: 192 mg/dL — ABNORMAL HIGH (ref 70–99)
Glucose-Capillary: 261 mg/dL — ABNORMAL HIGH (ref 70–99)
Glucose-Capillary: 330 mg/dL — ABNORMAL HIGH (ref 70–99)
Glucose-Capillary: 373 mg/dL — ABNORMAL HIGH (ref 70–99)

## 2022-03-12 LAB — CBG MONITORING, ED
Glucose-Capillary: 179 mg/dL — ABNORMAL HIGH (ref 70–99)
Glucose-Capillary: 189 mg/dL — ABNORMAL HIGH (ref 70–99)
Glucose-Capillary: 196 mg/dL — ABNORMAL HIGH (ref 70–99)
Glucose-Capillary: 212 mg/dL — ABNORMAL HIGH (ref 70–99)
Glucose-Capillary: 216 mg/dL — ABNORMAL HIGH (ref 70–99)
Glucose-Capillary: 218 mg/dL — ABNORMAL HIGH (ref 70–99)
Glucose-Capillary: 220 mg/dL — ABNORMAL HIGH (ref 70–99)
Glucose-Capillary: 296 mg/dL — ABNORMAL HIGH (ref 70–99)
Glucose-Capillary: 326 mg/dL — ABNORMAL HIGH (ref 70–99)
Glucose-Capillary: 349 mg/dL — ABNORMAL HIGH (ref 70–99)
Glucose-Capillary: 484 mg/dL — ABNORMAL HIGH (ref 70–99)

## 2022-03-12 LAB — MRSA NEXT GEN BY PCR, NASAL: MRSA by PCR Next Gen: NOT DETECTED

## 2022-03-12 LAB — HEMOGLOBIN A1C
Hgb A1c MFr Bld: 12.6 % — ABNORMAL HIGH (ref 4.8–5.6)
Mean Plasma Glucose: 314.92 mg/dL

## 2022-03-12 LAB — BETA-HYDROXYBUTYRIC ACID
Beta-Hydroxybutyric Acid: 0.61 mmol/L — ABNORMAL HIGH (ref 0.05–0.27)
Beta-Hydroxybutyric Acid: 1.05 mmol/L — ABNORMAL HIGH (ref 0.05–0.27)

## 2022-03-12 LAB — HIV ANTIBODY (ROUTINE TESTING W REFLEX): HIV Screen 4th Generation wRfx: NONREACTIVE

## 2022-03-12 MED ORDER — INSULIN REGULAR(HUMAN) IN NACL 100-0.9 UT/100ML-% IV SOLN
INTRAVENOUS | Status: DC
  Administered 2022-03-12: 19 [IU]/h via INTRAVENOUS
  Administered 2022-03-12: 8.5 [IU]/h via INTRAVENOUS
  Filled 2022-03-12 (×2): qty 100

## 2022-03-12 MED ORDER — INSULIN ASPART 100 UNIT/ML IJ SOLN
0.0000 [IU] | Freq: Every day | INTRAMUSCULAR | Status: DC
  Filled 2022-03-12: qty 0.05

## 2022-03-12 MED ORDER — LISINOPRIL 10 MG PO TABS
10.0000 mg | ORAL_TABLET | Freq: Every day | ORAL | Status: DC
Start: 2022-03-12 — End: 2022-03-14
  Administered 2022-03-12 – 2022-03-14 (×3): 10 mg via ORAL
  Filled 2022-03-12 (×3): qty 1

## 2022-03-12 MED ORDER — DEXTROSE 50 % IV SOLN: 0.0000 mL | INTRAVENOUS | Status: DC | PRN

## 2022-03-12 MED ORDER — DEXTROSE IN LACTATED RINGERS 5 % IV SOLN: INTRAVENOUS | Status: DC

## 2022-03-12 MED ORDER — INSULIN GLARGINE-YFGN 100 UNIT/ML ~~LOC~~ SOLN
10.0000 [IU] | Freq: Every day | SUBCUTANEOUS | Status: DC
  Administered 2022-03-12: 10 [IU] via SUBCUTANEOUS
  Filled 2022-03-12: qty 0.1

## 2022-03-12 MED ORDER — LACTATED RINGERS IV SOLN: INTRAVENOUS | Status: DC

## 2022-03-12 MED ORDER — FAMOTIDINE 20 MG PO TABS
10.0000 mg | ORAL_TABLET | Freq: Every day | ORAL | Status: DC
  Administered 2022-03-12 – 2022-03-14 (×3): 10 mg via ORAL
  Filled 2022-03-12 (×3): qty 1

## 2022-03-12 MED ORDER — CHLORHEXIDINE GLUCONATE CLOTH 2 % EX PADS
6.0000 | MEDICATED_PAD | Freq: Every day | CUTANEOUS | Status: DC
  Administered 2022-03-12 – 2022-03-14 (×2): 6 via TOPICAL

## 2022-03-12 MED ORDER — INSULIN REGULAR(HUMAN) IN NACL 100-0.9 UT/100ML-% IV SOLN: INTRAVENOUS | Status: DC

## 2022-03-12 MED ORDER — INSULIN ASPART 100 UNIT/ML IJ SOLN
0.0000 [IU] | Freq: Three times a day (TID) | INTRAMUSCULAR | Status: DC
  Administered 2022-03-12: 20 [IU] via SUBCUTANEOUS
  Filled 2022-03-12: qty 0.2

## 2022-03-12 MED ORDER — ONDANSETRON HCL 4 MG/2ML IJ SOLN
4.0000 mg | Freq: Four times a day (QID) | INTRAMUSCULAR | Status: DC | PRN
Start: 2022-03-12 — End: 2022-03-14
  Administered 2022-03-12 (×2): 4 mg via INTRAVENOUS
  Filled 2022-03-12 (×2): qty 2

## 2022-03-12 MED ORDER — HEPARIN SODIUM (PORCINE) 5000 UNIT/ML IJ SOLN
5000.0000 [IU] | Freq: Three times a day (TID) | INTRAMUSCULAR | Status: DC
  Administered 2022-03-12 (×2): 5000 [IU] via SUBCUTANEOUS
  Filled 2022-03-12 (×3): qty 1

## 2022-03-12 MED ORDER — ACETAMINOPHEN 325 MG PO TABS
650.0000 mg | ORAL_TABLET | Freq: Once | ORAL | Status: AC
  Administered 2022-03-13: 650 mg via ORAL
  Filled 2022-03-12: qty 2

## 2022-03-12 MED ORDER — INSULIN ASPART 100 UNIT/ML IJ SOLN
5.0000 [IU] | Freq: Three times a day (TID) | INTRAMUSCULAR | Status: DC
  Filled 2022-03-12: qty 0.05

## 2022-03-12 NOTE — ED Notes (Signed)
CBG 216 

## 2022-03-12 NOTE — Assessment & Plan Note (Addendum)
Admit to observation to stepdown unit.  Continue IV insulin per protocol for HHS.  BMP every 4 hours.  Patient has a [facility] primary care provider.  Will likely need to be on subcu insulin and a basal bolus fashion at discharge.  Patient states that metformin has not worked for her in the past due to nausea, diarrhea.  A1c already sent in the ER.  Hyperosmolar hyperglycemic state (HHS) ([place]) is a Acute illness/condition that poses a threat to life or bodily function.

## 2022-03-12 NOTE — Assessment & Plan Note (Signed)
Chronic.  Patient states that she needs to be on lisinopril but has not been taking it.  start at 10 mg daily.

## 2022-03-12 NOTE — ED Notes (Signed)
Pt ambulatory without assistance.  

## 2022-03-12 NOTE — ED Notes (Signed)
Updating care events based off of noted times during [facility] downtime.

## 2022-03-12 NOTE — ED Notes (Signed)
MD made aware of CBG 

## 2022-03-12 NOTE — ED Notes (Signed)
Pt continues to ambulate without staff assistance, maintaining steady gait to bathroom and back to room.

## 2022-03-12 NOTE — H&P (Signed)
History and Physical    Meredith Thompson [id number] DOB: 1985-11-20 DOA: 03/11/2022  DOS: the patient was seen and examined on 03/11/2022  PCP: Joya Gaskins, FNP   Patient coming from: Home  I have personally briefly reviewed patient's old medical records in [facility]  Chief complaint: Hyperglycemia History of present illness: 36 year old African-American female history of morbid obesity, reflux, elevated blood pressure, prediabetes presents to the ER today with blood sugars greater than 500.  Seen in the PCP office.  Patient is awake for his primary care provider.  Patient's had elevated A1c's for over a year now.  Patient's been on metformin without success.  Caused a lot of GI distress including diarrhea.  Patient's had polydipsia and polyuria for over a month now.  Is been drinking excessive amounts of water, Gatorade, Powerade, sports drinks.  He has been urinating excessively.  Brought to ER for evaluation.  On arrival temp 98, heart rate 124, blood pressure 145/108  Labs:  Initial serum glucose of 715.  Patient started on IV insulin.  Beta-hydroxybutyrate elevated at 2.32. Serum bicarb 23.  [facility] hospitalist contacted for admission.   ED Course: serum glucose 715. Started on insulin gtts.  Review of Systems:  Review of Systems  Constitutional:  Positive for malaise/fatigue. Negative for chills and fever.  HENT: Negative.    Eyes: Negative.   Respiratory: Negative.    Cardiovascular: Negative.   Gastrointestinal: Negative.   Genitourinary:        Lots of vaginal discharge.  Musculoskeletal: Negative.   Skin: Negative.   Neurological: Negative.   Endo/Heme/Allergies:  Positive for polydipsia.       Polyuria.  Psychiatric/Behavioral: Negative.    All other systems reviewed and are negative.  Past Medical History:  Diagnosis Date   Acid reflux    Allergy    Diabetes mellitus without complication ([place])    Stress incontinence of urine      Past Surgical History:  Procedure Laterality Date   [place]     reports that she has quit smoking. Her smoking use included cigars. She has never used smokeless tobacco. She reports that she does not currently use alcohol. She reports that she does not currently use drugs after having used the following drugs: Marijuana.  Allergies  Allergen Reactions   Wellbutrin [Bupropion] Other (See Comments)    Seizures     Family History  Problem Relation Age of Onset   Diabetes Mother    Hyperlipidemia Mother    Heart disease Mother        CHF   Hypertension Mother    Mental illness Mother    Diabetes Father    Hyperlipidemia Father    Hypertension Father     Prior to Admission medications   Medication Sig Start Date End Date Taking? Authorizing Provider  famotidine (PEPCID) 10 MG tablet Take 10 mg by mouth daily.   Yes [provider]  levonorgestrel (MIRENA) 20 MCG/24HR IUD 1 each by Intrauterine route once.   Yes [provider]  omeprazole (PRILOSEC OTC) 20 MG tablet Take 20 mg by mouth daily.   Yes [provider]    Physical Exam: Vitals:   03/11/22 2235 03/11/22 2236 03/11/22 2237 03/11/22 2315  BP: (!) 147/92  (!) 147/92 (!) 136/91  Pulse:  99 99 (!) 103  Resp:  16 16 (!) 21  Temp:      TempSrc:      SpO2:  99% 99%  99%  Weight:      Height:        Physical Exam Vitals and nursing note reviewed.  Constitutional:      General: She is not in acute distress.    Appearance: Normal appearance. She is obese. She is not ill-appearing, toxic-appearing or diaphoretic.  HENT:     Head: Normocephalic and atraumatic.     Nose: Nose normal. No rhinorrhea.  Eyes:     General: No scleral icterus. Cardiovascular:     Rate and Rhythm: Normal rate and regular rhythm.     Pulses: Normal pulses.  Pulmonary:     Effort: Pulmonary effort is normal. No respiratory distress.     Breath sounds: Normal breath sounds. No  rales.  Abdominal:     General: Bowel sounds are normal. There is no distension.     Palpations: Abdomen is soft. There is no mass.     Tenderness: There is generalized abdominal tenderness. There is no guarding or rebound.     Hernia: No hernia is present.  Musculoskeletal:     Right lower leg: No edema.     Left lower leg: No edema.  Skin:    General: Skin is warm and dry.     Capillary Refill: Capillary refill takes less than 2 seconds.  Neurological:     General: No focal deficit present.     Mental Status: She is alert and oriented to person, place, and time.     Labs on Admission: I have personally reviewed following labs and imaging studies  CBC: Recent Labs  Lab 03/11/22 1922  WBC 6.7  NEUTROABS 4.0  HGB 14.1  HCT 41.9  MCV 90.9  PLT 123456   Basic Metabolic Panel: Recent Labs  Lab 03/11/22 1922 03/11/22 2151  NA 130* 134*  K 6.9* 5.9*  CL 94* 96*  CO2 20* 23  GLUCOSE 715* 588*  BUN 14 14  CREATININE 1.23* 0.94  CALCIUM 9.4 9.9   GFR: Estimated Creatinine Clearance: 109.7 mL/min (by C-G formula based on SCr of 0.94 mg/dL). Liver Function Tests: No results for input(s): AST, ALT, ALKPHOS, BILITOT, PROT, ALBUMIN in the last 168 hours. Recent Labs  Lab 03/11/22 2037  LIPASE 51   No results for input(s): AMMONIA in the last 168 hours. Coagulation Profile: No results for input(s): INR, PROTIME in the last 168 hours. Cardiac Enzymes: No results for input(s): CKTOTAL, CKMB, CKMBINDEX, TROPONINI, TROPONINIHS in the last 168 hours. BNP (last 3 results) No results for input(s): PROBNP in the last 8760 hours. HbA1C: No results for input(s): HGBA1C in the last 72 hours. CBG: Recent Labs  Lab 03/11/22 1915 03/11/22 2017 03/11/22 2238 03/11/22 2309 03/11/22 2342  GLUCAP >600* >600* 524* 451* 391*   Lipid Profile: No results for input(s): CHOL, HDL, LDLCALC, TRIG, CHOLHDL, LDLDIRECT in the last 72 hours. Thyroid Function Tests: No results for input(s):  TSH, T4TOTAL, FREET4, T3FREE, THYROIDAB in the last 72 hours. Anemia Panel: No results for input(s): VITAMINB12, FOLATE, FERRITIN, TIBC, IRON, RETICCTPCT in the last 72 hours. Urine analysis:    Component Value Date/Time   COLORURINE STRAW (A) 03/11/2022 2039   APPEARANCEUR CLEAR 03/11/2022 2039   LABSPEC 1.026 03/11/2022 2039   PHURINE 6.0 03/11/2022 2039   GLUCOSEU >=500 (A) 03/11/2022 2039   HGBUR LARGE (A) 03/11/2022 2039   BILIRUBINUR NEGATIVE 03/11/2022 2039   KETONESUR 20 (A) 03/11/2022 2039   PROTEINUR NEGATIVE 03/11/2022 2039   NITRITE NEGATIVE 03/11/2022 2039   LEUKOCYTESUR TRACE (  A) 03/11/2022 2039    Radiological Exams on Admission: I have personally reviewed images CT Abdomen Pelvis Wo Contrast  Result Date: 03/11/2022 CLINICAL DATA:  Flank pain blood in urine EXAM: CT ABDOMEN AND PELVIS WITHOUT CONTRAST TECHNIQUE: Multidetector CT imaging of the abdomen and pelvis was performed following the standard protocol without IV contrast. RADIATION DOSE REDUCTION: This exam was performed according to the departmental dose-optimization program which includes automated exposure control, adjustment of the mA and/or kV according to patient size and/or use of iterative reconstruction technique. COMPARISON:  CT 01/15/2018 FINDINGS: Lower chest: No acute abnormality. Hepatobiliary: Liver is enlarged measuring 22 cm. Diffuse hypodensity consistent with hepatic steatosis. No calcified gallstone or biliary dilatation Pancreas: Unremarkable. No pancreatic ductal dilatation or surrounding inflammatory changes. Spleen: Normal in size without focal abnormality. Adrenals/Urinary Tract: Adrenal glands are unremarkable. Kidneys are normal, without renal calculi, focal lesion, or hydronephrosis. Bladder is unremarkable. Stomach/Bowel: Stomach is within normal limits. Appendix appears normal. No evidence of bowel wall thickening, distention, or inflammatory changes. Vascular/Lymphatic: No significant vascular  findings are present. No enlarged abdominal or pelvic lymph nodes. Reproductive: IUD in the uterus.  No adnexal mass Other: No abdominal wall hernia or abnormality. No abdominopelvic ascites. Musculoskeletal: No acute or significant osseous findings. IMPRESSION: 1. No CT evidence for acute intra-abdominal or pelvic abnormality. 2. Enlarged fatty liver Electronically Signed   By: Donavan Foil M.D.   On: 03/11/2022 22:39   DG Chest 2 View  Result Date: 03/11/2022 CLINICAL DATA:  Abdomen pain EXAM: CHEST - 2 VIEW COMPARISON:  11/22/2017 FINDINGS: The heart size and mediastinal contours are within normal limits. Both lungs are clear. The visualized skeletal structures are unremarkable. IMPRESSION: No active cardiopulmonary disease. Electronically Signed   By: Donavan Foil M.D.   On: 03/11/2022 20:46    EKG: My personal interpretation of EKG shows: sinus tachycardia    Assessment/Plan Principal Problem:   Hyperosmolar hyperglycemic state (HHS) ([place]) Active Problems:   Morbid obesity with BMI of 50.0-59.9, adult (HCC)   GERD (gastroesophageal reflux disease)   Primary hypertension    Assessment and Plan: * Hyperosmolar hyperglycemic state (HHS) ([place]) Admit to observation to stepdown unit.  Continue IV insulin per protocol for HHS.  BMP every 4 hours.  Patient has a [facility] primary care provider.  Will likely need to be on subcu insulin and a basal bolus fashion at discharge.  Patient states that metformin has not worked for her in the past due to nausea, diarrhea.  A1c already sent in the ER.  Hyperosmolar hyperglycemic state (HHS) ([place]) is a Acute illness/condition that poses a threat to life or bodily function.   Primary hypertension Chronic.  Patient states that she needs to be on lisinopril but has not been taking it.  start at 10 mg daily.  GERD (gastroesophageal reflux disease) Chronic.  Continue Pepcid 10 mg daily  Morbid obesity with BMI of 50.0-59.9, adult (HCC) Chronic.  BMI  today 51.37.  Weight 131.5 kg.   DVT prophylaxis: SQ Heparin Code Status: Full Code Family Communication: no family at bedside  Disposition Plan: return home  Consults called: none  Admission status: Observation, Step Down Unit   Kristopher Oppenheim, DO [facility] 03/12/2022, 12:09 AM

## 2022-03-12 NOTE — Progress Notes (Signed)
Subjective: Patient admitted this morning, see detailed H&P by Dr. Imogene Burn 36 year old female with history of morbid obesity, acid reflux, hypertension, diabetes mellitus presented with blood glucose greater than 500.  Patient only takes metformin at home which she has not been taking as she has been intolerant.  In the ED she was found to be hyper glycemic hyperosmolar state.  Started on IV insulin via Endo tool.  Vitals:   03/12/22 0700 03/12/22 0730  BP: 128/80 (!) 130/99  Pulse: (!) 109 (!) 107  Resp: (!) 25 (!) 24  Temp:  98.8 F (37.1 C)  SpO2: 99% 97%      A/P Hyperosmolar hyperglycemic state Resolved Blood glucose is in 200s Bicarb is 25 this morning Beta-hydroxybutyrate 0.61 Patient is hungry, will discontinue IV insulin Start carb modified diet, sliding scale insulin with NovoLog, Lantus 10 units subcu daily Obtain hemoglobin A1c    Meredeth Ide [facility]

## 2022-03-12 NOTE — Subjective & Objective (Signed)
Chief complaint: Hyperglycemia History of present illness: 36 year old African-American female history of morbid obesity, reflux, elevated blood pressure, prediabetes presents to the ER today with blood sugars greater than 500.  Seen in the PCP office.  Patient is awake for his primary care provider.  Patient's had elevated A1c's for over a year now.  Patient's been on metformin without success.  Caused a lot of GI distress including diarrhea.  Patient's had polydipsia and polyuria for over a month now.  Is been drinking excessive amounts of water, Gatorade, Powerade, sports drinks.  He has been urinating excessively.  Brought to ER for evaluation.  On arrival temp 98, heart rate 124, blood pressure 145/108  Labs:  Initial serum glucose of 715.  Patient started on IV insulin.  Beta-hydroxybutyrate elevated at 2.32. Serum bicarb 23.  [facility] hospitalist contacted for admission.

## 2022-03-12 NOTE — Assessment & Plan Note (Addendum)
Chronic.  BMI today 51.37.  Weight 131.5 kg.

## 2022-03-12 NOTE — Assessment & Plan Note (Signed)
Chronic.  Continue Pepcid 10 mg daily

## 2022-03-12 NOTE — Progress Notes (Signed)
Inpatient Diabetes Program Recommendations  AACE/ADA: New Consensus Statement on Inpatient Glycemic Control (2015)  Target Ranges:  Prepandial:   less than 140 mg/dL      Peak postprandial:   less than 180 mg/dL (1-2 hours)      Critically ill patients:  140 - 180 mg/dL   Lab Results  Component Value Date   GLUCAP 175 (H) 03/12/2022   HGBA1C 12.6 (H) 03/12/2022    Review of Glycemic Control  Diabetes history: DM2 Outpatient Diabetes medications: None (previously on metformin) Current orders for Inpatient glycemic control: IV insulin per EndoTool for Hyperglycemia  HgbA1C - 12.6%  Inpatient Diabetes Program Recommendations:    Semglee 20 units Q24H Novolog 0-15 units TID with meals and 0-5 HS Novolog 6 units TID with meals if eating > 50%  Long discussion with pt regarding her diabetes control and HgbA1C of 12.6%. Discussed basic pathophysiology of DM Type 2, basic home care, importance of checking CBGs and maintaining good CBG control to prevent long-term and short-term complications. Reviewed glucose and A1C goals and explained that patient will need to continue to monitor blood sugars at least 3-4x/day and take to PCP for review. Reviewed signs and symptoms of hyperglycemia and hypoglycemia along with treatment for both. Discussed impact of nutrition, exercise, stress, sickness, and medications on diabetes control. Reviewed Living Well with diabetes booklet and encouraged patient to read through entire book. Pt states she was unable to take metformin d/t nausea and diarrhea. Has been very sedentary and weight has increased recently. Discussed how lifestyle modification can assist with blood sugar control and management. Answered questions. Demonstrated insulin pen use. RN to allow pt to give own injections and stick finger for CBG checks. Will also order OP Diabetes Education consult. Instructed to f/u with PCP within a week or two of discharge.  Continue to review diabetes survival  skills. Will teach insulin pen administration on 6/9 prior to discharge.   Continue to follow.  Thank you. Ailene Ards, RD, LDN, CDE Inpatient Diabetes Coordinator [phone]

## 2022-03-12 NOTE — ED Notes (Signed)
Unsuccessful Ultrasound IV attempt by EMT-P x1 and Allysa RN x2.

## 2022-03-13 DIAGNOSIS — T383X6A Underdosing of insulin and oral hypoglycemic [antidiabetic] drugs, initial encounter: Secondary | ICD-10-CM | POA: Diagnosis present

## 2022-03-13 DIAGNOSIS — E1100 Type 2 diabetes mellitus with hyperosmolarity without nonketotic hyperglycemic-hyperosmolar coma (NKHHC): Secondary | ICD-10-CM | POA: Diagnosis not present

## 2022-03-13 DIAGNOSIS — R319 Hematuria, unspecified: Secondary | ICD-10-CM

## 2022-03-13 DIAGNOSIS — I10 Essential (primary) hypertension: Secondary | ICD-10-CM | POA: Diagnosis not present

## 2022-03-13 DIAGNOSIS — Z888 Allergy status to other drugs, medicaments and biological substances status: Secondary | ICD-10-CM | POA: Diagnosis not present

## 2022-03-13 DIAGNOSIS — Z8249 Family history of ischemic heart disease and other diseases of the circulatory system: Secondary | ICD-10-CM | POA: Diagnosis not present

## 2022-03-13 DIAGNOSIS — Z87891 Personal history of nicotine dependence: Secondary | ICD-10-CM | POA: Diagnosis not present

## 2022-03-13 DIAGNOSIS — Z818 Family history of other mental and behavioral disorders: Secondary | ICD-10-CM | POA: Diagnosis not present

## 2022-03-13 DIAGNOSIS — R1084 Generalized abdominal pain: Secondary | ICD-10-CM

## 2022-03-13 DIAGNOSIS — Z83438 Family history of other disorder of lipoprotein metabolism and other lipidemia: Secondary | ICD-10-CM | POA: Diagnosis not present

## 2022-03-13 DIAGNOSIS — R739 Hyperglycemia, unspecified: Principal | ICD-10-CM | POA: Diagnosis present

## 2022-03-13 DIAGNOSIS — Z79899 Other long term (current) drug therapy: Secondary | ICD-10-CM | POA: Diagnosis not present

## 2022-03-13 DIAGNOSIS — Z833 Family history of diabetes mellitus: Secondary | ICD-10-CM | POA: Diagnosis not present

## 2022-03-13 DIAGNOSIS — Z91128 Patient's intentional underdosing of medication regimen for other reason: Secondary | ICD-10-CM | POA: Diagnosis not present

## 2022-03-13 DIAGNOSIS — Y92009 Unspecified place in unspecified non-institutional (private) residence as the place of occurrence of the external cause: Secondary | ICD-10-CM | POA: Diagnosis not present

## 2022-03-13 DIAGNOSIS — Z6841 Body Mass Index (BMI) 40.0 and over, adult: Secondary | ICD-10-CM | POA: Diagnosis not present

## 2022-03-13 DIAGNOSIS — K219 Gastro-esophageal reflux disease without esophagitis: Secondary | ICD-10-CM | POA: Diagnosis present

## 2022-03-13 DIAGNOSIS — E11649 Type 2 diabetes mellitus with hypoglycemia without coma: Secondary | ICD-10-CM | POA: Diagnosis not present

## 2022-03-13 DIAGNOSIS — Z975 Presence of (intrauterine) contraceptive device: Secondary | ICD-10-CM | POA: Diagnosis not present

## 2022-03-13 LAB — GLUCOSE, CAPILLARY
Glucose-Capillary: 156 mg/dL — ABNORMAL HIGH (ref 70–99)
Glucose-Capillary: 160 mg/dL — ABNORMAL HIGH (ref 70–99)
Glucose-Capillary: 167 mg/dL — ABNORMAL HIGH (ref 70–99)
Glucose-Capillary: 169 mg/dL — ABNORMAL HIGH (ref 70–99)
Glucose-Capillary: 178 mg/dL — ABNORMAL HIGH (ref 70–99)
Glucose-Capillary: 184 mg/dL — ABNORMAL HIGH (ref 70–99)
Glucose-Capillary: 198 mg/dL — ABNORMAL HIGH (ref 70–99)
Glucose-Capillary: 240 mg/dL — ABNORMAL HIGH (ref 70–99)
Glucose-Capillary: 283 mg/dL — ABNORMAL HIGH (ref 70–99)
Glucose-Capillary: 295 mg/dL — ABNORMAL HIGH (ref 70–99)
Glucose-Capillary: 297 mg/dL — ABNORMAL HIGH (ref 70–99)

## 2022-03-13 LAB — BASIC METABOLIC PANEL
Anion gap: 10 (ref 5–15)
BUN: 10 mg/dL (ref 6–20)
CO2: 25 mmol/L (ref 22–32)
Calcium: 9.5 mg/dL (ref 8.9–10.3)
Chloride: 102 mmol/L (ref 98–111)
Creatinine, Ser: 0.91 mg/dL (ref 0.44–1.00)
GFR, Estimated: >60 mL/min (ref >60)
Glucose, Bld: 152 mg/dL — ABNORMAL HIGH (ref 70–99)
Potassium: 4.3 mmol/L (ref 3.5–5.1)
Sodium: 137 mmol/L (ref 135–145)

## 2022-03-13 LAB — BETA-HYDROXYBUTYRIC ACID: Beta-Hydroxybutyric Acid: 0.24 mmol/L (ref 0.05–0.27)

## 2022-03-13 MED ORDER — INSULIN GLARGINE-YFGN 100 UNIT/ML ~~LOC~~ SOLN
30.0000 [IU] | Freq: Every day | SUBCUTANEOUS | Status: DC
  Administered 2022-03-13 – 2022-03-14 (×2): 30 [IU] via SUBCUTANEOUS
  Filled 2022-03-13 (×2): qty 0.3

## 2022-03-13 MED ORDER — INSULIN ASPART 100 UNIT/ML IJ SOLN
5.0000 [IU] | Freq: Three times a day (TID) | INTRAMUSCULAR | Status: DC
  Administered 2022-03-13 – 2022-03-14 (×4): 5 [IU] via SUBCUTANEOUS

## 2022-03-13 MED ORDER — INSULIN STARTER KIT- PEN NEEDLES (ENGLISH)
1.0000 | Freq: Once | Status: AC
  Administered 2022-03-13: 1
  Filled 2022-03-13: qty 1

## 2022-03-13 MED ORDER — INSULIN ASPART 100 UNIT/ML IJ SOLN
0.0000 [IU] | Freq: Every day | INTRAMUSCULAR | Status: DC
  Administered 2022-03-13: 3 [IU] via SUBCUTANEOUS

## 2022-03-13 MED ORDER — SENNOSIDES-DOCUSATE SODIUM 8.6-50 MG PO TABS
1.0000 | ORAL_TABLET | Freq: Every evening | ORAL | Status: DC | PRN
  Administered 2022-03-13: 1 via ORAL
  Filled 2022-03-13: qty 1

## 2022-03-13 MED ORDER — LIVING WELL WITH DIABETES BOOK
Freq: Once | Status: AC
  Filled 2022-03-13: qty 1

## 2022-03-13 MED ORDER — INSULIN ASPART 100 UNIT/ML IJ SOLN
0.0000 [IU] | Freq: Three times a day (TID) | INTRAMUSCULAR | Status: DC
  Administered 2022-03-13: 7 [IU] via SUBCUTANEOUS
  Administered 2022-03-13 – 2022-03-14 (×2): 11 [IU] via SUBCUTANEOUS
  Administered 2022-03-14: 7 [IU] via SUBCUTANEOUS

## 2022-03-13 MED ORDER — INSULIN ASPART 100 UNIT/ML IJ SOLN: 3.0000 [IU] | Freq: Three times a day (TID) | INTRAMUSCULAR | Status: DC

## 2022-03-13 MED ORDER — POLYETHYLENE GLYCOL 3350 17 G PO PACK
17.0000 g | PACK | Freq: Every day | ORAL | Status: DC | PRN
  Administered 2022-03-13: 17 g via ORAL
  Filled 2022-03-13: qty 1

## 2022-03-13 NOTE — Progress Notes (Signed)
Inpatient Diabetes Program Recommendations  AACE/ADA: New Consensus Statement on Inpatient Glycemic Control (2015)  Target Ranges:  Prepandial:   less than 140 mg/dL      Peak postprandial:   less than 180 mg/dL (1-2 hours)      Critically ill patients:  140 - 180 mg/dL   Lab Results  Component Value Date   GLUCAP 295 (H) 03/13/2022   HGBA1C 12.6 (H) 03/12/2022    Review of Glycemic Control  Diabetes history: New-onset Outpatient Diabetes medications: None Current orders for Inpatient glycemic control: Semglee 30 QD, Novolog 0-20 TID with meals and 0-5 HS + 5 units TID  HgbA1C - 12.6%  Inpatient Diabetes Program Recommendations:    For home:  Lantus Solostar pen 30 units QD Novolog Flexpen 10 units TID with meals Blood glucose meter kit (#[id number]) Insulin pen needles (#[id number])  To f/u with PCP within a week or two and take blood sugar logs for review.   Reviewed insulin pen administration. Discussed hypoglycemia s/s and treatment, healthy eating using the plate method and portion control, exercise (walking daily), and monitoring blood sugars before meals and HS. Pt states she feels comfortable with going home on insulin and using insulin pen. Needs much encouragement. Will benefit from OP Diabetes Education consult.   Continue to follow.  Thank you. Lorenda Peck, RD, LDN, CDE Inpatient Diabetes Coordinator [phone]

## 2022-03-13 NOTE — Progress Notes (Signed)
  Transition of Care [facility]) Screening Note   Patient Details  Name: Meredith Thompson Date of Birth: 02-Apr-1986   Transition of Care [facility]) CM/SW Contact:    Amada Jupiter, LCSW Phone Number: 03/13/2022, 10:43 AM    Transition of Care Department [facility]) has reviewed patient and no TOC needs have been identified at this time. We will continue to monitor patient advancement through interdisciplinary progression rounds. If new patient transition needs arise, please place a TOC consult.

## 2022-03-13 NOTE — Progress Notes (Signed)
[facility]  PROGRESS NOTE  Meredith Thompson [id number] DOB: August 02, 1986 DOA: 03/11/2022 PCP: Joya Gaskins, FNP   Brief HPI:    36 year old female with history of morbid obesity, acid reflux, hypertension, diabetes mellitus presented with blood glucose greater than 500.  Patient only takes metformin at home which she has not been taking as she has been intolerant.  In the ED she was found to be hyper glycemic hyperosmolar state.  Started on IV insulin via Endo tool. Hypoglycemia resolved.  IV insulin was discontinued Patient started on sliding scale insulin with NovoLog and Lantus     Subjective   Patient seen and examined, hyperglycemia has resolved.    Assessment/Plan:    Hyperosmolar hyperglycemic state/diabetes mellitus type 2 -Resolved -IV insulin has been discontinued, patient started on Lantus 30 units subcu daily -Resistant sliding scale insulin NovoLog, 5 units 3 times daily meal coverage -Hemoglobin A1c is 12.6 -We will closely monitor patient's blood glucose and adjust insulin blood sugar before discharge in a.m.  ?  Hematuria -Patient complained of hematuria yesterday -Repeat catheterized urine specimen obtained showed no hematuria -Likely from her menstruation  Hypertension -Continue lisinopril     Medications     Chlorhexidine Gluconate Cloth  6 each Topical Daily   famotidine  10 mg Oral Daily   heparin  5,000 Units Subcutaneous Q8H   insulin aspart  0-20 Units Subcutaneous TID WC   insulin aspart  0-5 Units Subcutaneous QHS   insulin aspart  5 Units Subcutaneous TID WC   insulin glargine-yfgn  30 Units Subcutaneous Daily   insulin starter kit- pen needles  1 kit Other Once   lisinopril  10 mg Oral Daily   living well with diabetes book   Does not apply Once     Data Reviewed:   CBG:  Recent Labs  Lab 03/13/22 0433 03/13/22 0633 03/13/22 0905 03/13/22 1046 03/13/22 1127  GLUCAP 160* 167* 184* 297* 295*    SpO2:  98 %    Vitals:   03/13/22 0911 03/13/22 1000 03/13/22 1100 03/13/22 1318  BP: 129/78 128/82 107/71   Pulse:  (!) 107 (!) 107   Resp:  18 (!) 21   Temp:    98.2 F (36.8 C)  TempSrc:    Oral  SpO2:  99% 98%   Weight:      Height:          Data Reviewed:  Basic Metabolic Panel: Recent Labs  Lab 03/11/22 2151 03/12/22 0525 03/12/22 1204 03/12/22 1745 03/13/22 0048  NA 134* 140 136 137 137  K 5.9* 3.8 4.5 4.1 4.3  CL 96* 106 103 103 102  CO2 23 25 21* 22 25  GLUCOSE 588* 208* 453* 301* 152*  BUN 14 10 11 12 10   CREATININE 0.94 0.89 0.89 0.99 0.91  CALCIUM 9.9 9.3 9.2 9.1 9.5    CBC: Recent Labs  Lab 03/11/22 1922  WBC 6.7  NEUTROABS 4.0  HGB 14.1  HCT 41.9  MCV 90.9  PLT 375    LFT No results for input(s): "AST", "ALT", "ALKPHOS", "BILITOT", "PROT", "ALBUMIN" in the last 168 hours.   Antibiotics: Anti-infectives (From admission, onward)    None        DVT prophylaxis: Heparin  Code Status: Full code  Family Communication: No family at bedside   CONSULTS    Objective    Physical Examination:   General-appears in no acute distress Heart-S1-S2, regular, no murmur auscultated Lungs-clear to auscultation bilaterally, no  wheezing or crackles auscultated Abdomen-soft, nontender, no organomegaly Extremities-no edema in the lower extremities Neuro-alert, oriented x3, no focal deficit noted  Status is: Inpatient: Hyperosmolar hyperglycemic state         [place]   [facility] If 7PM-7AM, please contact night-coverage at [email or web address], Office  [phone]   03/13/2022, 2:03 PM  LOS: 0 days

## 2022-03-14 DIAGNOSIS — I10 Essential (primary) hypertension: Secondary | ICD-10-CM | POA: Diagnosis not present

## 2022-03-14 DIAGNOSIS — E1100 Type 2 diabetes mellitus with hyperosmolarity without nonketotic hyperglycemic-hyperosmolar coma (NKHHC): Secondary | ICD-10-CM | POA: Diagnosis not present

## 2022-03-14 DIAGNOSIS — R319 Hematuria, unspecified: Secondary | ICD-10-CM | POA: Diagnosis not present

## 2022-03-14 LAB — BASIC METABOLIC PANEL
Anion gap: 8 (ref 5–15)
BUN: 10 mg/dL (ref 6–20)
CO2: 25 mmol/L (ref 22–32)
Calcium: 8.9 mg/dL (ref 8.9–10.3)
Chloride: 104 mmol/L (ref 98–111)
Creatinine, Ser: 0.8 mg/dL (ref 0.44–1.00)
GFR, Estimated: >60 mL/min (ref >60)
Glucose, Bld: 277 mg/dL — ABNORMAL HIGH (ref 70–99)
Potassium: 4.4 mmol/L (ref 3.5–5.1)
Sodium: 137 mmol/L (ref 135–145)

## 2022-03-14 LAB — GLUCOSE, CAPILLARY
Glucose-Capillary: 300 mg/dL — ABNORMAL HIGH (ref 70–99)
Glucose-Capillary: 238 mg/dL — ABNORMAL HIGH (ref 70–99)

## 2022-03-14 MED ORDER — INSULIN ASPART 100 UNIT/ML FLEXPEN: 10.0000 [IU] | PEN_INJECTOR | Freq: Three times a day (TID) | SUBCUTANEOUS | 3 refills | Status: AC

## 2022-03-14 MED ORDER — BLOOD GLUCOSE METER KIT: PACK | 0 refills | Status: AC

## 2022-03-14 MED ORDER — ENSURE MAX PROTEIN PO LIQD: 11.0000 [oz_av] | Freq: Two times a day (BID) | ORAL | Status: DC

## 2022-03-14 MED ORDER — ADULT MULTIVITAMIN W/MINERALS CH
1.0000 | ORAL_TABLET | Freq: Every day | ORAL | Status: DC
Start: 2022-03-14 — End: 2022-03-14
  Administered 2022-03-14: 1 via ORAL
  Filled 2022-03-14: qty 1

## 2022-03-14 MED ORDER — "PEN NEEDLES 3/16"" 31G X 5 MM MISC": 2 refills | Status: AC

## 2022-03-14 MED ORDER — LISINOPRIL 10 MG PO TABS: 10.0000 mg | ORAL_TABLET | Freq: Every day | ORAL | 3 refills | Status: DC

## 2022-03-14 MED ORDER — PANTOPRAZOLE SODIUM 40 MG PO TBEC: 40.0000 mg | DELAYED_RELEASE_TABLET | Freq: Every day | ORAL | 1 refills | Status: DC

## 2022-03-14 MED ORDER — INSULIN GLARGINE 100 UNIT/ML SOLOSTAR PEN: 30.0000 [IU] | PEN_INJECTOR | Freq: Every day | SUBCUTANEOUS | 3 refills | Status: AC

## 2022-03-14 NOTE — Progress Notes (Signed)
Initial Nutrition Assessment RD working remotely.  DOCUMENTATION CODES:   Morbid obesity  INTERVENTION:  - will order Ensure Max BID, each supplement provides 150 kcal and 30 grams of protein. - will order 1 tablet multivitamin with minerals/day. - placed Carbohydrate Counting for People with Diabetes handout from the Academy of Nutrition and Dietetics in AVS.   NUTRITION DIAGNOSIS:   Increased nutrient needs related to acute illness as evidenced by estimated needs  GOAL:   Patient will meet greater than or equal to 90% of their needs  MONITOR:   PO intake, Supplement acceptance, Labs, Weight trends  REASON FOR ASSESSMENT:   Malnutrition Screening Tool  ASSESSMENT:   36 year old female with medical history of morbid obesity, reflux, elevated blood pressure, prediabetes, and stress-induced urine incontinence. She presented to the ED due to CBGs >500 mg/dl and elevated [id number] for >1 year. She has been on metformin which caused GI distress, including diarrhea. For the past 1 month she was experiencing polydipsia and polyuria for over 1 month. She has been drinking excessive amount of water, Gatorade, powerade, and sports drinks and urinating excessively.  Diet advanced from NPO to Carb Modified yesterday at 0818. No meal intakes documented since that time.   She has not been seen by a [place] RD at any time in the past. DM Coordinator saw patient yesterday and reviewed healthy eating utilizing the plate method and portion control and discussed daily exercise (such as walking daily).  Weight on 6/7 was documented as 290 lb which appears to possibly be a stated weight. PTA the most recently documented weight was 279 lb on 12/08/2017.   Labs reviewed; CBG: 300 mg/dl. Medications reviewed; sliding scale novolog, 5 units novolog TID, 30 units semglee/day.    NUTRITION - FOCUSED PHYSICAL EXAM:  RD working remotely.  Diet Order:   Diet Order             Diet Carb Modified  Fluid consistency: Thin; Room service appropriate? Yes  Diet effective now                   EDUCATION NEEDS:   Education needs have been addressed  Skin:  Skin Assessment: Reviewed RN Assessment  Last BM:  PTA/unknown  Height:   Ht Readings from Last 1 Encounters:  03/11/22 5\' 3"  (1.6 m)    Weight:   Wt Readings from Last 1 Encounters:  03/11/22 131.5 kg     BMI:  Body mass index is 51.37 kg/m.  Estimated Nutritional Needs:  Kcal:  1950-2200 kcal Protein:  95-110 grams Fluid:  >/= 2.2 L/day       05/11/22, MS, RD, LDN Registered Dietitian II Inpatient Clinical Nutrition RD pager # and on-call/weekend pager # available in [facility]

## 2022-03-14 NOTE — Discharge Instructions (Signed)

## 2022-03-14 NOTE — TOC Transition Note (Signed)
Transition of Care [facility]) - CM/SW Discharge Note   Patient Details  Name: Meredith Thompson MRN: [id number] Date of Birth: [birth year]  Transition of Care [facility]) CM/SW Contact:  Golda Acre, RN Phone Number: 03/14/2022, 12:32 PM   Clinical Narrative:    Dcd to return to home.  chart reviewed no toc needs present.   Final next level of care: Home/Self Care Barriers to Discharge: No Barriers Identified   Patient Goals and CMS Choice Patient states their goals for this hospitalization and ongoing recovery are:: to go home CMS Medicare.gov Compare Post Acute Care list provided to:: Patient    Discharge Placement                       Discharge Plan and Services   Discharge Planning Services: CM Consult                  Time DME Agency Contacted: 1231              Social Determinants of Health (SDOH) Interventions     Readmission Risk Interventions     No data to display

## 2022-03-14 NOTE — Discharge Summary (Addendum)
Physician Discharge Summary   Patient: Meredith Thompson MRN: [id number] DOB: 1985-12-28  Admit date:     03/11/2022  Discharge date: 03/14/22  Discharge Physician: Oswald Hillock   PCP: Joya Gaskins, FNP   Recommendations at discharge:   Follow-up PCP in 1 week  Discharge Diagnoses: Principal Problem:   Hyperosmolar hyperglycemic state (HHS) ([place]) Active Problems:   Morbid obesity with BMI of 50.0-59.9, adult (HCC)   GERD (gastroesophageal reflux disease)   Primary hypertension   Hyperglycemia  Resolved Problems:   * No resolved hospital problems. *  Hospital Course:  36 year old female with history of morbid obesity, acid reflux, hypertension, diabetes mellitus presented with blood glucose greater than 500.  Patient only takes metformin at home which she has not been taking as she has been intolerant.  In the ED she was found to be hyper glycemic hyperosmolar state.  Started on IV insulin via Endo tool. Hypoglycemia resolved.  IV insulin was discontinued Patient started on sliding scale insulin with NovoLog and Lantus  Assessment and Plan:  * Hyperosmolar hyperglycemic state (HHS) ([place]) Admit to observation to stepdown unit.  Continue IV insulin per protocol for HHS.  BMP every 4 hours.  Patient has a [facility] primary care provider.  Will likely need to be on subcu insulin and a basal bolus fashion at discharge.  Patient states that metformin has not worked for her in the past due to nausea, diarrhea.  A1c already sent in the ER.  Hyperosmolar hyperglycemic state (HHS) ([place]) is a Acute illness/condition that poses a threat to life or bodily function.   Primary hypertension Chronic.  Patient states that she needs to be on lisinopril but has not been taking it.  start at 10 mg daily.  GERD (gastroesophageal reflux disease) Chronic.  Continue Pepcid 10 mg daily  Morbid obesity with BMI of 50.0-59.9, adult (HCC) Chronic.  BMI today 51.37.  Weight 131.5  kg.   Hyperosmolar hyperglycemic state/diabetes mellitus type 2 -Resolved -IV insulin has been discontinued, patient started on Lantus 30 units subcu daily -Resistant sliding scale insulin NovoLog, 5 units 3 times daily meal coverage -Hemoglobin A1c is 12.6 -We will discharge on Lantus 30 units subcu daily, NovoLog FlexPen 10 units 3 times daily with meals   ?  Hematuria -Patient complained of hematuria yesterday -Repeat catheterized urine specimen obtained showed no hematuria -Likely from her menstruation   Hypertension -Continue lisinopril  GERD- start protonix 40 mg po daily      Consultants:  Procedures performed:   Disposition: Home Diet recommendation:  Discharge Diet Orders (From admission, onward)     Start     Ordered   03/14/22 0000  Diet - low sodium heart healthy        03/14/22 1142           Carb modified diet DISCHARGE MEDICATION: Allergies as of 03/14/2022       Reactions   Wellbutrin [bupropion] Other (See Comments)   Seizures         Medication List     STOP taking these medications    famotidine 10 MG tablet Commonly known as: PEPCID   omeprazole 20 MG tablet Commonly known as: PRILOSEC OTC       TAKE these medications    blood glucose meter kit and supplies Dispense based on patient and insurance preference. Use up to four times daily as directed. (FOR ICD-10 E10.9, E11.9).   insulin aspart 100 UNIT/ML FlexPen Commonly known as: NOVOLOG  Inject 10 Units into the skin 3 (three) times daily with meals.   insulin glargine 100 UNIT/ML Solostar Pen Commonly known as: LANTUS Inject 30 Units into the skin daily. Start taking on: March 15, 2022   levonorgestrel 20 MCG/24HR IUD Commonly known as: MIRENA 1 each by Intrauterine route once.   lisinopril 10 MG tablet Commonly known as: ZESTRIL Take 1 tablet (10 mg total) by mouth daily. Start taking on: March 15, 2022   pantoprazole 40 MG tablet Commonly known as:  Protonix Take 1 tablet (40 mg total) by mouth daily.   Pen Needles 3/16" 31G X 5 MM Misc Use with insulin pen        Follow-up Information     Joya Gaskins, FNP Follow up in 1 week(s).   Specialty: Family Medicine Contact information: [address] [place] [address] [phone]                Discharge Exam: Danley Danker Weights   03/11/22 2029  Weight: 131.5 kg   General-appears in no acute distress Heart-S1-S2, regular, no murmur auscultated Lungs-clear to auscultation bilaterally, no wheezing or crackles auscultated Abdomen-soft, nontender, no organomegaly Extremities-no edema in the lower extremities Neuro-alert, oriented x3, no focal deficit noted  Condition at discharge: good  The results of significant diagnostics from this hospitalization (including imaging, microbiology, ancillary and laboratory) are listed below for reference.   Imaging Studies: CT Abdomen Pelvis Wo Contrast  Result Date: 03/11/2022 CLINICAL DATA:  Flank pain blood in urine EXAM: CT ABDOMEN AND PELVIS WITHOUT CONTRAST TECHNIQUE: Multidetector CT imaging of the abdomen and pelvis was performed following the standard protocol without IV contrast. RADIATION DOSE REDUCTION: This exam was performed according to the departmental dose-optimization program which includes automated exposure control, adjustment of the mA and/or kV according to patient size and/or use of iterative reconstruction technique. COMPARISON:  CT 01/15/2018 FINDINGS: Lower chest: No acute abnormality. Hepatobiliary: Liver is enlarged measuring 22 cm. Diffuse hypodensity consistent with hepatic steatosis. No calcified gallstone or biliary dilatation Pancreas: Unremarkable. No pancreatic ductal dilatation or surrounding inflammatory changes. Spleen: Normal in size without focal abnormality. Adrenals/Urinary Tract: Adrenal glands are unremarkable. Kidneys are normal, without renal calculi, focal lesion, or hydronephrosis.  Bladder is unremarkable. Stomach/Bowel: Stomach is within normal limits. Appendix appears normal. No evidence of bowel wall thickening, distention, or inflammatory changes. Vascular/Lymphatic: No significant vascular findings are present. No enlarged abdominal or pelvic lymph nodes. Reproductive: IUD in the uterus.  No adnexal mass Other: No abdominal wall hernia or abnormality. No abdominopelvic ascites. Musculoskeletal: No acute or significant osseous findings. IMPRESSION: 1. No CT evidence for acute intra-abdominal or pelvic abnormality. 2. Enlarged fatty liver Electronically Signed   By: Donavan Foil M.D.   On: 03/11/2022 22:39   DG Chest 2 View  Result Date: 03/11/2022 CLINICAL DATA:  Abdomen pain EXAM: CHEST - 2 VIEW COMPARISON:  11/22/2017 FINDINGS: The heart size and mediastinal contours are within normal limits. Both lungs are clear. The visualized skeletal structures are unremarkable. IMPRESSION: No active cardiopulmonary disease. Electronically Signed   By: Donavan Foil M.D.   On: 03/11/2022 20:46    Microbiology: Results for orders placed or performed during the hospital encounter of 03/11/22  MRSA Next Gen by PCR, Nasal     Status: None   Collection Time: 03/12/22  3:02 PM   Specimen: Nasal Mucosa; Nasal Swab  Result Value Ref Range Status   MRSA by PCR Next Gen NOT DETECTED NOT DETECTED Final  Comment: (NOTE) The GeneXpert MRSA Assay (FDA approved for NASAL specimens only), is one component of a comprehensive MRSA colonization surveillance program. It is not intended to diagnose MRSA infection nor to guide or monitor treatment for MRSA infections. Test performance is not FDA approved in patients less than 71 years old. Performed at [facility], [place] [address] [place], [place] [address]     Labs: CBC: Recent Labs  Lab 03/11/22 1922  WBC 6.7  NEUTROABS 4.0  HGB 14.1  HCT 41.9  MCV 90.9  PLT 469   Basic Metabolic Panel: Recent Labs  Lab  03/12/22 0525 03/12/22 1204 03/12/22 1745 03/13/22 0048 03/14/22 0310  NA 140 136 137 137 137  K 3.8 4.5 4.1 4.3 4.4  CL 106 103 103 102 104  CO2 25 21* 22 25 25   GLUCOSE 208* 453* 301* 152* 277*  BUN 10 11 12 10 10   CREATININE 0.89 0.89 0.99 0.91 0.80  CALCIUM 9.3 9.2 9.1 9.5 8.9   Liver Function Tests: No results for input(s): "AST", "ALT", "ALKPHOS", "BILITOT", "PROT", "ALBUMIN" in the last 168 hours. CBG: Recent Labs  Lab 03/13/22 1127 03/13/22 1707 03/13/22 2136 03/14/22 0745 03/14/22 1146  GLUCAP 295* 240* 283* 300* 238*    Discharge time spent: greater than 30 minutes.  Signed: Oswald Hillock, MD [facility] 03/14/2022

## 2022-03-16 DIAGNOSIS — B3731 Acute candidiasis of vulva and vagina: Secondary | ICD-10-CM | POA: Diagnosis not present

## 2022-03-16 DIAGNOSIS — E1165 Type 2 diabetes mellitus with hyperglycemia: Secondary | ICD-10-CM | POA: Diagnosis not present

## 2022-03-16 DIAGNOSIS — Z09 Encounter for follow-up examination after completed treatment for conditions other than malignant neoplasm: Secondary | ICD-10-CM | POA: Diagnosis not present

## 2022-03-16 DIAGNOSIS — L819 Disorder of pigmentation, unspecified: Secondary | ICD-10-CM | POA: Diagnosis not present

## 2022-03-16 DIAGNOSIS — E1142 Type 2 diabetes mellitus with diabetic polyneuropathy: Secondary | ICD-10-CM | POA: Diagnosis not present

## 2022-03-18 DIAGNOSIS — R69 Illness, unspecified: Secondary | ICD-10-CM | POA: Diagnosis not present

## 2022-03-18 DIAGNOSIS — F411 Generalized anxiety disorder: Secondary | ICD-10-CM | POA: Diagnosis not present

## 2022-03-24 ENCOUNTER — Encounter: Payer: 59 | Attending: Physician Assistant | Admitting: Dietician

## 2022-03-24 ENCOUNTER — Encounter: Payer: Self-pay | Admitting: Dietician

## 2022-03-24 DIAGNOSIS — E119 Type 2 diabetes mellitus without complications: Secondary | ICD-10-CM | POA: Insufficient documentation

## 2022-03-24 NOTE — Progress Notes (Signed)
Patient was seen on 03/24/2022 for the first of a series of three diabetes self-management courses at [facility].  Patient Education Plan per assessed needs and concerns is to attend three course education program for Diabetes Self Management Education.  A1C was 12.6% on 03/12/2022..  The following learning objectives were met by the patient during this class: Describe diabetes, types of diabetes and pathophysiology State some common risk factors for diabetes Defines the role of glucose and insulin Describe the relationship between diabetes and cardiovascular and other risks State the members of the Healthcare Team States the rationale for glucose monitoring and when to test State their individual Target Range State the importance of logging glucose readings and how to interpret the readings Identifies A1C target Explain the correlation between A1c and eAG values State symptoms and treatment of high blood glucose and low blood glucose Explain proper technique for glucose testing and identify proper sharps disposal  Handouts given during class include: How to Thrive:  A Guide for Your Journey with Diabetes by the ADA Meal Plan Card and carbohydrate content list Dietary intake form Low Sodium Flavoring Tips Types of Fats Dining Out Label reading Snack list The diabetes portion plate Diabetes Resources A1c to eAG Conversion Chart Blood Glucose Log Diabetes Recommended Care Schedule Support Group Diabetes Success Plan Core Class Satisfaction Survey   Follow-Up Plan: Attend core 2  

## 2022-03-31 ENCOUNTER — Encounter: Payer: Self-pay | Admitting: Dietician

## 2022-03-31 ENCOUNTER — Encounter: Payer: 59 | Attending: Physician Assistant | Admitting: Dietician

## 2022-03-31 DIAGNOSIS — E1110 Type 2 diabetes mellitus with ketoacidosis without coma: Secondary | ICD-10-CM | POA: Diagnosis present

## 2022-04-03 DIAGNOSIS — F411 Generalized anxiety disorder: Secondary | ICD-10-CM | POA: Diagnosis not present

## 2022-04-03 DIAGNOSIS — R69 Illness, unspecified: Secondary | ICD-10-CM | POA: Diagnosis not present

## 2022-04-06 DIAGNOSIS — F411 Generalized anxiety disorder: Secondary | ICD-10-CM | POA: Diagnosis not present

## 2022-04-06 DIAGNOSIS — R69 Illness, unspecified: Secondary | ICD-10-CM | POA: Diagnosis not present

## 2022-04-08 DIAGNOSIS — F411 Generalized anxiety disorder: Secondary | ICD-10-CM | POA: Diagnosis not present

## 2022-04-08 DIAGNOSIS — R69 Illness, unspecified: Secondary | ICD-10-CM | POA: Diagnosis not present

## 2022-04-14 ENCOUNTER — Ambulatory Visit: Payer: 59

## 2022-04-15 DIAGNOSIS — R69 Illness, unspecified: Secondary | ICD-10-CM | POA: Diagnosis not present

## 2022-04-15 DIAGNOSIS — F411 Generalized anxiety disorder: Secondary | ICD-10-CM | POA: Diagnosis not present

## 2022-05-07 DIAGNOSIS — F411 Generalized anxiety disorder: Secondary | ICD-10-CM | POA: Diagnosis not present

## 2022-05-07 DIAGNOSIS — R69 Illness, unspecified: Secondary | ICD-10-CM | POA: Diagnosis not present

## 2022-05-26 DIAGNOSIS — F411 Generalized anxiety disorder: Secondary | ICD-10-CM | POA: Diagnosis not present

## 2022-05-26 DIAGNOSIS — R69 Illness, unspecified: Secondary | ICD-10-CM | POA: Diagnosis not present

## 2022-06-01 DIAGNOSIS — R69 Illness, unspecified: Secondary | ICD-10-CM | POA: Diagnosis not present

## 2022-06-01 DIAGNOSIS — F411 Generalized anxiety disorder: Secondary | ICD-10-CM | POA: Diagnosis not present

## 2022-06-09 DIAGNOSIS — R69 Illness, unspecified: Secondary | ICD-10-CM | POA: Diagnosis not present

## 2022-06-09 DIAGNOSIS — F411 Generalized anxiety disorder: Secondary | ICD-10-CM | POA: Diagnosis not present

## 2022-06-10 DIAGNOSIS — E1165 Type 2 diabetes mellitus with hyperglycemia: Secondary | ICD-10-CM | POA: Diagnosis not present

## 2022-06-10 DIAGNOSIS — Z794 Long term (current) use of insulin: Secondary | ICD-10-CM | POA: Diagnosis not present

## 2022-06-10 DIAGNOSIS — I10 Essential (primary) hypertension: Secondary | ICD-10-CM | POA: Diagnosis not present

## 2022-06-19 DIAGNOSIS — F411 Generalized anxiety disorder: Secondary | ICD-10-CM | POA: Diagnosis not present

## 2022-06-19 DIAGNOSIS — R69 Illness, unspecified: Secondary | ICD-10-CM | POA: Diagnosis not present

## 2022-06-24 DIAGNOSIS — I10 Essential (primary) hypertension: Secondary | ICD-10-CM | POA: Diagnosis not present

## 2022-06-24 DIAGNOSIS — E1165 Type 2 diabetes mellitus with hyperglycemia: Secondary | ICD-10-CM | POA: Diagnosis not present

## 2022-06-26 DIAGNOSIS — E785 Hyperlipidemia, unspecified: Secondary | ICD-10-CM | POA: Diagnosis not present

## 2022-06-26 DIAGNOSIS — E1165 Type 2 diabetes mellitus with hyperglycemia: Secondary | ICD-10-CM | POA: Diagnosis not present

## 2022-06-26 DIAGNOSIS — I10 Essential (primary) hypertension: Secondary | ICD-10-CM | POA: Diagnosis not present

## 2022-06-29 DIAGNOSIS — E1165 Type 2 diabetes mellitus with hyperglycemia: Secondary | ICD-10-CM | POA: Diagnosis not present

## 2022-06-29 DIAGNOSIS — F411 Generalized anxiety disorder: Secondary | ICD-10-CM | POA: Diagnosis not present

## 2022-06-29 DIAGNOSIS — R69 Illness, unspecified: Secondary | ICD-10-CM | POA: Diagnosis not present

## 2022-07-07 DIAGNOSIS — R69 Illness, unspecified: Secondary | ICD-10-CM | POA: Diagnosis not present

## 2022-07-07 DIAGNOSIS — F411 Generalized anxiety disorder: Secondary | ICD-10-CM | POA: Diagnosis not present

## 2022-07-08 DIAGNOSIS — E785 Hyperlipidemia, unspecified: Secondary | ICD-10-CM | POA: Diagnosis not present

## 2022-07-08 DIAGNOSIS — E1165 Type 2 diabetes mellitus with hyperglycemia: Secondary | ICD-10-CM | POA: Diagnosis not present

## 2022-07-13 DIAGNOSIS — F411 Generalized anxiety disorder: Secondary | ICD-10-CM | POA: Diagnosis not present

## 2022-07-13 DIAGNOSIS — R69 Illness, unspecified: Secondary | ICD-10-CM | POA: Diagnosis not present

## 2022-07-24 DIAGNOSIS — K219 Gastro-esophageal reflux disease without esophagitis: Secondary | ICD-10-CM | POA: Diagnosis not present

## 2022-07-24 DIAGNOSIS — E1165 Type 2 diabetes mellitus with hyperglycemia: Secondary | ICD-10-CM | POA: Diagnosis not present

## 2022-07-24 DIAGNOSIS — I10 Essential (primary) hypertension: Secondary | ICD-10-CM | POA: Diagnosis not present

## 2022-07-24 DIAGNOSIS — E785 Hyperlipidemia, unspecified: Secondary | ICD-10-CM | POA: Diagnosis not present

## 2022-08-03 DIAGNOSIS — F411 Generalized anxiety disorder: Secondary | ICD-10-CM | POA: Diagnosis not present

## 2022-08-03 DIAGNOSIS — R69 Illness, unspecified: Secondary | ICD-10-CM | POA: Diagnosis not present

## 2022-08-04 DIAGNOSIS — F411 Generalized anxiety disorder: Secondary | ICD-10-CM | POA: Diagnosis not present

## 2022-08-04 DIAGNOSIS — R69 Illness, unspecified: Secondary | ICD-10-CM | POA: Diagnosis not present

## 2022-08-12 DIAGNOSIS — R69 Illness, unspecified: Secondary | ICD-10-CM | POA: Diagnosis not present

## 2022-08-12 DIAGNOSIS — F411 Generalized anxiety disorder: Secondary | ICD-10-CM | POA: Diagnosis not present

## 2022-08-17 NOTE — Progress Notes (Signed)
This encounter was created in error - please disregard.

## 2022-08-19 DIAGNOSIS — F411 Generalized anxiety disorder: Secondary | ICD-10-CM | POA: Diagnosis not present

## 2022-08-19 DIAGNOSIS — R69 Illness, unspecified: Secondary | ICD-10-CM | POA: Diagnosis not present

## 2022-09-02 DIAGNOSIS — F411 Generalized anxiety disorder: Secondary | ICD-10-CM | POA: Diagnosis not present

## 2022-09-02 DIAGNOSIS — R69 Illness, unspecified: Secondary | ICD-10-CM | POA: Diagnosis not present

## 2022-11-03 ENCOUNTER — Ambulatory Visit: Payer: 59 | Admitting: Internal Medicine

## 2022-12-04 ENCOUNTER — Other Ambulatory Visit: Payer: Self-pay | Admitting: Family Medicine

## 2022-12-04 ENCOUNTER — Ambulatory Visit
Admission: RE | Admit: 2022-12-04 | Discharge: 2022-12-04 | Disposition: A | Discharge status: Home / Self Care | Payer: Commercial Managed Care - PPO | Source: Ambulatory Visit | Attending: Family Medicine | Admitting: Family Medicine

## 2022-12-04 DIAGNOSIS — R052 Subacute cough: Secondary | ICD-10-CM

## 2022-12-04 DIAGNOSIS — R062 Wheezing: Secondary | ICD-10-CM

## 2022-12-15 ENCOUNTER — Ambulatory Visit: Payer: Commercial Managed Care - PPO | Admitting: Cardiology

## 2022-12-15 ENCOUNTER — Encounter: Payer: Self-pay | Admitting: Cardiology

## 2022-12-15 VITALS — BP 132/88 | HR 100 | Resp 18 | Ht 63.0 in | Wt 286.0 lb

## 2022-12-15 DIAGNOSIS — G4730 Sleep apnea, unspecified: Secondary | ICD-10-CM

## 2022-12-15 DIAGNOSIS — Z794 Long term (current) use of insulin: Secondary | ICD-10-CM

## 2022-12-15 DIAGNOSIS — R0609 Other forms of dyspnea: Secondary | ICD-10-CM

## 2022-12-15 DIAGNOSIS — R072 Precordial pain: Secondary | ICD-10-CM

## 2022-12-15 DIAGNOSIS — E782 Mixed hyperlipidemia: Secondary | ICD-10-CM

## 2022-12-15 DIAGNOSIS — R000 Tachycardia, unspecified: Secondary | ICD-10-CM

## 2022-12-15 DIAGNOSIS — F17290 Nicotine dependence, other tobacco product, uncomplicated: Secondary | ICD-10-CM

## 2022-12-15 DIAGNOSIS — I10 Essential (primary) hypertension: Secondary | ICD-10-CM

## 2022-12-15 MED ORDER — ROSUVASTATIN CALCIUM 20 MG PO TABS: 20.0000 mg | ORAL_TABLET | Freq: Every day | ORAL | 0 refills | Status: AC

## 2022-12-15 NOTE — Progress Notes (Signed)
ID:  Meredith Thompson, DOB 21-Aug-1986, MRN [id number]  PCP:  Joya Gaskins, FNP  Cardiologist:  Rex Kras, DO, [facility] (established care 12/15/2022)  REASON FOR CONSULT: Precordial pain and tachycardia  REQUESTING PHYSICIAN:  Joya Gaskins, [address] [place],  [place] [address]  Chief Complaint  Patient presents with   Tachycardia   Chest Pain   New Patient (Initial Visit)    HPI  Meredith Thompson is a 37 y.o. African-American female who presents to the clinic for evaluation of precordial pain and tachycardia at the request of Joya Gaskins, *. Her past medical history and cardiovascular risk factors include: Hypertension, GERD, IDDM type II, HLD, sleep apnea not on device, obesity due to excess calories.  Precordial pain: Patient is been experiencing chest tightness over the left anterior precordium, intensity 6 out of 10, lasting for about 20 minutes, self-limited.  At times but not always brought on by effort related activities and, does improve with rest.  Overall intensity frequency and duration has not changed.  In addition, at times both patient and other providers have noted elevated heart rate concerning for tachycardia.  But she denies any near-syncope or syncopal events.  Patient states that she sleeps enough hours but still feels tired and fatigued.  She has diagnosis of sleep apnea but does not use device and/or oral appliance.  Her chest pain could also be secondary to increased cigar smoking and/or anxiety associated (per patient).  She has been on Ozempic which has facilitated weight loss up to 30 pounds thus far.  She is congratulated on her weight loss journey.  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  No family history of premature coronary artery disease or sudden cardiac death.  However, it appears that mother may have had congenital heart disease requiring multiple surgeries during her lifetime and  eventually was diagnosed with heart failure and died in her 22s.  FUNCTIONAL STATUS: No structured exercise program or daily routine.   ALLERGIES: Allergies  Allergen Reactions   Metformin Nausea And Vomiting   Wellbutrin [Bupropion] Other (See Comments)    Seizures     MEDICATION LIST PRIOR TO VISIT: Current Meds  Medication Sig   ADVAIR HFA 230-21 MCG/ACT inhaler Inhale 2 puffs into the lungs 2 (two) times daily.   blood glucose meter kit and supplies Dispense based on patient and insurance preference. Use up to four times daily as directed. (FOR ICD-10 E10.9, E11.9).   insulin aspart (NOVOLOG) 100 UNIT/ML FlexPen Inject 10 Units into the skin 3 (three) times daily with meals.   insulin glargine (LANTUS) 100 UNIT/ML Solostar Pen Inject 30 Units into the skin daily.   Insulin Pen Needle (PEN NEEDLES 3/16") 31G X 5 MM MISC Use with insulin pen   levonorgestrel (MIRENA) 20 MCG/24HR IUD 1 each by Intrauterine route once.   losartan (COZAAR) 100 MG tablet Take 100 mg by mouth daily.   omeprazole (PRILOSEC) 40 MG capsule Take 40 mg by mouth every morning.   OZEMPIC, 0.25 OR 0.5 MG/DOSE, 2 MG/3ML SOPN Inject 0.5 mg into the skin once a week.   rosuvastatin (CRESTOR) 20 MG tablet Take 1 tablet (20 mg total) by mouth at bedtime.   sertraline (ZOLOFT) 50 MG tablet Take 1 tablet by mouth daily.   VENTOLIN HFA 108 (90 Base) MCG/ACT inhaler Inhale 2 puffs into the lungs every 6 (six) hours as needed for shortness of breath.   [DISCONTINUED] rosuvastatin (  CRESTOR) 10 MG tablet Take 10 mg by mouth daily.     PAST MEDICAL HISTORY: Past Medical History:  Diagnosis Date   Acid reflux    Allergy    Diabetes mellitus without complication (HCC)    Hyperlipidemia    Hypertension    Stress incontinence of urine     PAST SURGICAL HISTORY: Past Surgical History:  Procedure Laterality Date   TONSILLECTOMY AND ADENOIDECTOMY  1999    FAMILY HISTORY: The patient family history includes Diabetes  in her father and mother; Heart disease in her mother; Heart failure in her mother; Hyperlipidemia in her father and mother; Hypertension in her father and mother; Mental illness in her mother.  SOCIAL HISTORY:  The patient  reports that she has been smoking cigars. She has never used smokeless tobacco. She reports that she does not currently use alcohol. She reports that she does not currently use drugs after having used the following drugs: Marijuana.  REVIEW OF SYSTEMS: Review of Systems  Constitutional: Positive for malaise/fatigue.  Cardiovascular:  Positive for chest pain and dyspnea on exertion. Negative for claudication, irregular heartbeat, leg swelling, near-syncope, orthopnea, palpitations, paroxysmal nocturnal dyspnea and syncope.  Respiratory:  Positive for shortness of breath.   Hematologic/Lymphatic: Negative for bleeding problem.  Musculoskeletal:  Negative for muscle cramps and myalgias.  Neurological:  Negative for dizziness and light-headedness.    PHYSICAL EXAM:    12/15/2022   12:51 PM 03/24/2022    2:58 PM 03/14/2022   12:00 PM  Vitals with BMI  Height '5\' 3"'$  5' 3.5"   Weight 286 lbs 306 lbs   BMI [id number]   Systolic [id number]  0000000  Diastolic 88  99  Pulse [id number]  110    Physical Exam  Constitutional: No distress.  Age appropriate, hemodynamically stable.   Neck: No JVD present.  Cardiovascular: Normal rate, regular rhythm, S1 normal, S2 normal, intact distal pulses and normal pulses. Exam reveals no gallop, no S3 and no S4.  No murmur heard. Pulses:      Dorsalis pedis pulses are 2+ on the right side and 2+ on the left side.       Posterior tibial pulses are 2+ on the right side and 2+ on the left side.  Pulmonary/Chest: Effort normal and breath sounds normal. No stridor. She has no wheezes. She has no rales.  Abdominal: Soft. Bowel sounds are normal. She exhibits no distension. There is no abdominal tenderness.  Pannus present.  Musculoskeletal:         General: No edema.     Cervical back: Neck supple.  Neurological: She is alert and oriented to person, place, and time. She has intact cranial nerves (2-12).  Skin: Skin is warm and moist.   CARDIAC DATABASE: EKG: 12/15/2022: Sinus rhythm, 92 bpm, without underlying ischemia or injury pattern.  Echocardiogram: No results found for this or any previous visit from the past 1095 days.    Stress Testing: No results found for this or any previous visit from the past 1095 days.   Heart Catheterization: None  LABORATORY DATA:    Latest Ref Rng & Units 03/11/2022    7:22 PM 01/15/2018    2:10 AM 07/20/2016   10:14 AM  CBC  WBC 4.0 - 10.5 K/uL 6.7  9.1  8.4   Hemoglobin 12.0 - 15.0 g/dL 14.1  12.6  12.2   Hematocrit 36.0 - 46.0 % 41.9  37.5  36.7   Platelets 150 - 400 K/uL 375  359  379.0        Latest Ref Rng & Units 03/14/2022    3:10 AM 03/13/2022   12:48 AM 03/12/2022    5:45 PM  CMP  Glucose 70 - 99 mg/dL 277  152  301   BUN 6 - 20 mg/dL '10  10  12   '$ Creatinine 0.44 - 1.00 mg/dL 0.80  0.91  0.99   Sodium 135 - 145 mmol/L 137  137  137   Potassium 3.5 - 5.1 mmol/L 4.4  4.3  4.1   Chloride 98 - 111 mmol/L 104  102  103   CO2 22 - 32 mmol/L '25  25  22   '$ Calcium 8.9 - 10.3 mg/dL 8.9  9.5  9.1     Lipid Panel     Component Value Date/Time   CHOL 165 07/20/2016 1014   TRIG 87.0 07/20/2016 1014   HDL 34.70 (L) 07/20/2016 1014   CHOLHDL 5 07/20/2016 1014   VLDL 17.4 07/20/2016 1014   LDLCALC 113 (H) 07/20/2016 1014    No components found for: "NTPROBNP" No results for input(s): "PROBNP" in the last 8760 hours. No results for input(s): "TSH" in the last 8760 hours.  BMP Recent Labs    03/12/22 1745 03/13/22 0048 03/14/22 0310  NA 137 137 137  K 4.1 4.3 4.4  CL 103 102 104  CO2 '22 25 25  '$ GLUCOSE 301* 152* 277*  BUN '12 10 10  '$ CREATININE 0.99 0.91 0.80  CALCIUM 9.1 9.5 8.9  GFRNONAA >60 >60 >60    HEMOGLOBIN A1C Lab Results  Component Value Date   HGBA1C  12.6 (H) 03/12/2022   MPG 314.92 03/12/2022   Labs from care everywhere: Component 12/02/22 06/24/22 04/25/19  LDL Direct 91 131 High  120  Total Cholesterol 133 177 159  Triglycerides 82 125 96  HDL Cholesterol 32 Low  37 40  Total Chol / HDL Cholesterol 4.2 4.8 High  4.0  Non-HDL Cholesterol 101  140  119   Coronary Heart Disease Risk Table --     12/02/2022  Ref Range & Units 13 d ago Comments  Sodium 135 - 146 MMOL/L 136   Potassium 3.5 - 5.3 MMOL/L 4.4   Chloride 98 - 110 MMOL/L 100   CO2 23 - 30 MMOL/L 29   BUN 8 - 24 MG/DL 10   Glucose 70 - 99 MG/DL 91 Patients taking eltrombopag at doses >/= 100 mg daily may show falsely elevated values of 10% or greater.  Creatinine 0.50 - 1.50 MG/DL 0.79   Calcium 8.5 - 10.5 MG/DL 9.7   Total Protein 6.0 - 8.3 G/DL 6.8 Patients taking eltrombopag at doses >/= 100 mg daily may show falsely elevated values of 10% or greater.  Albumin 3.5 - 5.0 G/DL 4.0   Total Bilirubin 0.1 - 1.2 MG/DL 0.8 Patients taking eltrombopag at doses >/= 100 mg daily may show falsely elevated values of 10% or greater.  Alkaline Phosphatase 25 - 125 IU/L or U/L 102   AST (SGOT) 5 - 40 IU/L or U/L 15   ALT (SGPT) 5 - 50 IU/L or U/L 16   Anion Gap 4 - 14 MMOL/L 6   Est. GFR >=60 ML/MIN/1.73 M*2 >90 GFR estimated by CKD-EPI equations(NKF 2021).  "Recommend confirmation of Cr-based eGFR by using Cys-based eGFR and other filtration markers (if applicable) in complex cases and clinical decision-making, as needed."   Component 12/02/22 12/02/22 06/24/22 06/24/22 02/24/22 02/24/22  CBC with Differential --  Viewing -- Details  -- Details   HX BASOPHIL % 0 -- 1 -- 1 --  HX BASOPHIL ABSOLUTE COUNT 0.0 -- 0.0 -- 0.1 --  HX EOSINOPHIL ABSOLUTE COUNT 0.2 -- 0.1 -- 0.1 --  HX EOSINOPHIL% 2 -- 2 -- 1 --  HX HCT 36.3 -- 38.4 -- 44.3 --  HX HGB 12.0 Low  -- 12.5 -- 14.7 --  HX LYMPHOCYTE ABSOLUTE COUNT 3.2 -- 2.4 -- 2.6 --  HX LYMPHOCYTE% 41 -- 32 -- 27 --  HX MCH 29.4 -- 29.5  -- 29.2 --  HX MCHC 33.2 -- 32.5 Low  -- 33.2 --  HX MCV 88.6 -- 90.8 -- 88.2 --  HX MONOCYTE ABSOLUTE COUNT 0.3 -- 0.3 -- 0.3 --  HX MONOCYTE% 4 -- 5 -- 3 --  HX MPV 7.8 -- 7.9 -- 8.3 --  HX NEUTROPHIL % 52 -- 61 -- 68 --  HX NEUTROPHIL ABSOLUTE COUNT 4.0 -- 4.7 -- 6.6 --  HX PLT 440 -- 388 -- 442 --  HX RBC 4.10 -- 4.23 -- 5.03 --  HX RDW 13.8 -- 13.9 -- 13.7 --  HX WBC 7.7        Component 12/02/22 08/18/21  HX TSH 2.31  2.39     IMPRESSION:    ICD-10-CM   1. Precordial pain  R07.2 EKG 12-Lead    PCV CARDIAC STRESS TEST    PCV ECHOCARDIOGRAM COMPLETE    CT CARDIAC SCORING ([facility] LOCATIONS ONLY)    2. Dyspnea on exertion  R06.09 PCV CARDIAC STRESS TEST    PCV ECHOCARDIOGRAM COMPLETE    CT CARDIAC SCORING ([facility] LOCATIONS ONLY)    3. Tachycardia  R00.0     4. Benign hypertension  I10     5. Mixed hyperlipidemia  E78.2 rosuvastatin (CRESTOR) 20 MG tablet    6. Type 2 diabetes mellitus with hyperglycemia, with long-term current use of insulin (HCC)  E11.65    Z79.4     7. Sleep apnea in adult  G47.30     8. Cigar smoker  F17.290     9. Class 3 severe obesity due to excess calories with serious comorbidity and body mass index (BMI) of 50.0 to 59.9 in adult [place] [facility])  E66.01    Z68.43        RECOMMENDATIONS: Meredith Thompson is a 37 y.o. African-American female whose past medical history and cardiac risk factors include: Hypertension, GERD, IDDM type II, HLD, sleep apnea not on device, obesity due to excess calories.  Precordial pain Dyspnea on exertion Precordial pain has both cardiac and noncardiac features. Currently asymptomatic. EKG nonischemic. Multiple cardiovascular risk factors including but not limited to diabetes, hypertension, hyperlipidemia. Shared decision was to proceed with additional workup and risk stratification. Echo will be ordered to evaluate for structural heart disease and left ventricular systolic function. Exercise treadmill stress  test Coronary calcium score  Tachycardia Likely physiological due to underlying obesity, deconditioning. No associated near-syncope or syncopal events. Hemoglobin lower limits of normal-additional workup per PCP TS H within normal limits Will proceed with ischemic workup as outlined above. Will consider Zio patch at the next office visit if this worsens.  Benign hypertension Office blood pressures are well-controlled. Medications reconciled. No changes warranted at this time.  Mixed hyperlipidemia Currently on rosuvastatin.   She denies myalgia or other side effects. Most recent lipids dated February 2024, independently reviewed as noted above. Will increase rosuvastatin to 20 mg p.o. nightly to target a direct LDL less  than 70 mg/dL.  Type 2 diabetes mellitus with hyperglycemia, with long-term current use of insulin [facility] [place]) Currently follows with endocrinology and primary team.  Sleep apnea in adult Noncompliant with device therapy. Given the fact that she feels tired fatigued despite sleeping 6 to 7 hours/day and caring a CDL license I have encouraged her the importance of compliance with CPAP and/or device therapy that was prescribed to her.  She will follow-up with her sleep provider  Cigar smoker Educated her on the importance of complete cessation for cigar smoking and/or nicotine products  Patient is asked to increase physical activity as tolerated.  She is educated on not to overexert until ischemic workup is complete.  We emphasized importance of improving her modifiable cardiovascular risk factors given her young age.  FINAL MEDICATION LIST END OF ENCOUNTER: Meds ordered this encounter  Medications   rosuvastatin (CRESTOR) 20 MG tablet    Sig: Take 1 tablet (20 mg total) by mouth at bedtime.    Dispense:  90 tablet    Refill:  0    Medications Discontinued During This Encounter  Medication Reason   pantoprazole (PROTONIX) 40 MG tablet    rosuvastatin (CRESTOR) 10  MG tablet Dose change   lisinopril (ZESTRIL) 10 MG tablet      Current Outpatient Medications:    ADVAIR HFA 230-21 MCG/ACT inhaler, Inhale 2 puffs into the lungs 2 (two) times daily., Disp: , Rfl:    blood glucose meter kit and supplies, Dispense based on patient and insurance preference. Use up to four times daily as directed. (FOR ICD-10 E10.9, E11.9)., Disp: 1 each, Rfl: 0   insulin aspart (NOVOLOG) 100 UNIT/ML FlexPen, Inject 10 Units into the skin 3 (three) times daily with meals., Disp: 15 mL, Rfl: 3   insulin glargine (LANTUS) 100 UNIT/ML Solostar Pen, Inject 30 Units into the skin daily., Disp: 15 mL, Rfl: 3   Insulin Pen Needle (PEN NEEDLES 3/16") 31G X 5 MM MISC, Use with insulin pen, Disp: 100 each, Rfl: 2   levonorgestrel (MIRENA) 20 MCG/24HR IUD, 1 each by Intrauterine route once., Disp: , Rfl:    losartan (COZAAR) 100 MG tablet, Take 100 mg by mouth daily., Disp: , Rfl:    omeprazole (PRILOSEC) 40 MG capsule, Take 40 mg by mouth every morning., Disp: , Rfl:    OZEMPIC, 0.25 OR 0.5 MG/DOSE, 2 MG/3ML SOPN, Inject 0.5 mg into the skin once a week., Disp: , Rfl:    rosuvastatin (CRESTOR) 20 MG tablet, Take 1 tablet (20 mg total) by mouth at bedtime., Disp: 90 tablet, Rfl: 0   sertraline (ZOLOFT) 50 MG tablet, Take 1 tablet by mouth daily., Disp: , Rfl:    VENTOLIN HFA 108 (90 Base) MCG/ACT inhaler, Inhale 2 puffs into the lungs every 6 (six) hours as needed for shortness of breath., Disp: , Rfl:   Orders Placed This Encounter  Procedures   CT CARDIAC SCORING ([facility] LOCATIONS ONLY)   PCV CARDIAC STRESS TEST   EKG 12-Lead   PCV ECHOCARDIOGRAM COMPLETE    There are no Patient Instructions on file for this visit.   --Continue cardiac medications as reconciled in final medication list. --Return in about 6 weeks (around 01/26/2023) for Reevaluation of, Chest pain. or sooner if needed. --Continue follow-up with your primary care physician regarding the management of your other chronic  comorbid conditions.  Patient's questions and concerns were addressed to her satisfaction. She voices understanding of the instructions provided during this encounter.   This note  was created using a voice recognition software as a result there may be grammatical errors inadvertently enclosed that do not reflect the nature of this encounter. Every attempt is made to correct such errors.  Rex Kras, [place], [facility]  Pager:  [phone] Office: [phone]

## 2022-12-24 ENCOUNTER — Ambulatory Visit: Payer: Commercial Managed Care - PPO

## 2022-12-24 DIAGNOSIS — R0609 Other forms of dyspnea: Secondary | ICD-10-CM

## 2022-12-24 DIAGNOSIS — R072 Precordial pain: Secondary | ICD-10-CM

## 2023-01-15 ENCOUNTER — Ambulatory Visit: Payer: Commercial Managed Care - PPO

## 2023-01-15 DIAGNOSIS — R072 Precordial pain: Secondary | ICD-10-CM

## 2023-01-15 DIAGNOSIS — R0609 Other forms of dyspnea: Secondary | ICD-10-CM

## 2023-02-02 ENCOUNTER — Ambulatory Visit: Payer: Commercial Managed Care - PPO | Admitting: Cardiology

## 2023-03-09 ENCOUNTER — Ambulatory Visit: Payer: Commercial Managed Care - PPO | Admitting: Cardiology

## 2023-03-25 ENCOUNTER — Ambulatory Visit: Payer: Self-pay | Admitting: Cardiology

## 2023-03-31 NOTE — Progress Notes (Signed)
No Show.   No Charge.   Rayelynn Loyal, DO, FACC 

## 2023-04-21 ENCOUNTER — Encounter: Payer: Self-pay | Admitting: Nurse Practitioner

## 2023-04-21 ENCOUNTER — Ambulatory Visit: Payer: Commercial Managed Care - PPO | Admitting: Nurse Practitioner

## 2023-04-21 VITALS — BP 118/88 | HR 104 | Ht 63.0 in | Wt 268.8 lb

## 2023-04-21 DIAGNOSIS — G4733 Obstructive sleep apnea (adult) (pediatric): Secondary | ICD-10-CM | POA: Diagnosis not present

## 2023-04-21 DIAGNOSIS — R0681 Apnea, not elsewhere classified: Secondary | ICD-10-CM | POA: Insufficient documentation

## 2023-04-21 DIAGNOSIS — Z6841 Body Mass Index (BMI) 40.0 and over, adult: Secondary | ICD-10-CM | POA: Diagnosis not present

## 2023-04-21 DIAGNOSIS — G4719 Other hypersomnia: Secondary | ICD-10-CM

## 2023-04-21 NOTE — Patient Instructions (Signed)
Given your symptoms, I am concerned that you may have sleep disordered breathing with sleep apnea. You will need a sleep study for further evaluation. Someone will contact you to schedule this.   We discussed how untreated sleep apnea puts an individual at risk for cardiac arrhthymias, pulm HTN, DM, stroke and increases their risk for daytime accidents. We also briefly reviewed treatment options including weight loss, side sleeping position, oral appliance, CPAP therapy or referral to ENT for possible surgical options  Use caution when driving and pull over if you become sleepy.  Follow up in 6 weeks with Katie Cobb,NP to go over sleep study results, or sooner, if needed   

## 2023-04-21 NOTE — Assessment & Plan Note (Signed)
 See above

## 2023-04-21 NOTE — Progress Notes (Signed)
@Patient  ID: Meredith Thompson, female    DOB: Oct 01, 1986, 37 y.o.   MRN: [id number]  Chief Complaint  Patient presents with   Consult    Patient had sleep study 3 years ago at a [facility] office-was told she had sleep apnea but never followed up     Referring provider: Trisha Mangle, *  HPI: 37 year old female, active smoker referred for sleep consult. Past medical history significant for HTN, GERD, HHS, obesity.   TEST/EVENTS:   04/21/2023: Today - sleep consult Patient presents today for sleep consult referred by Grace Bushy, FNP. She has a longstanding history of difficulties with sleep. She had a sleep study a few years ago that showed moderate OSA but she never started on CPAP. She actually feels like she may be worse the last year or so. Feeling more fatigued during the day. Wakes feeling poorly rested. She feels like her sleep quality is terrible. She has loud snoring and witnessed apneas. She has had some trouble with drowsy driving but has never fallen asleep while driving. She will pull over if she becomes sleepy and get a red bull. She does have morning headaches and dry mouth. She denies any sleep parasomnias/paralysis.  She works night shift. She goes to bed around 3 am. Falls asleep within 25-30 min. Wakes 7-8 times. She wakes up around 2:30-3 pm. She does not take any sleep aids. She had gained a lot of weight and was up to 324 lb in December but she's been working on weight loss since and is down 60 lb. She does not have her previous sleep study. No O2 use.  She has a history of HTN, diabetes. No history of stroke. She had tonsillectomy and adenoidectomy in 1998. She is an active smoker; 1 cigar 5 x a week. She seldomly drinks alcohol. She lives with her father. Works as a Estate agent. No significant family history reported.  Epworth 18  Allergies  Allergen Reactions   Metformin Nausea And Vomiting   Wellbutrin [Bupropion] Other (See Comments)     Seizures     Immunization History  Administered Date(s) Administered   Hep A / Hep B 03/16/2017, 04/16/2017   Hepatitis B, ADULT 09/23/2017   Tdap 03/16/2017    Past Medical History:  Diagnosis Date   Acid reflux    Allergy    Diabetes mellitus without complication (HCC)    Hyperlipidemia    Hypertension    Stress incontinence of urine     Tobacco History: Social History   Tobacco Use  Smoking Status Every Day   Types: Cigars   Passive exposure: Current  Smokeless Tobacco Never  Tobacco Comments   Smokes about 5 cigars weekly. 04/21/23 PAP    Ready to quit: Not Answered Counseling given: Not Answered Tobacco comments: Smokes about 5 cigars weekly. 04/21/23 PAP    Outpatient Medications Prior to Visit  Medication Sig Dispense Refill   blood glucose meter kit and supplies Dispense based on patient and insurance preference. Use up to four times daily as directed. (FOR ICD-10 E10.9, E11.9). 1 each 0   hydrochlorothiazide (HYDRODIURIL) 25 MG tablet Take 25 mg by mouth daily.     insulin aspart (NOVOLOG) 100 UNIT/ML FlexPen Inject 10 Units into the skin 3 (three) times daily with meals. 15 mL 3   insulin glargine (LANTUS) 100 UNIT/ML Solostar Pen Inject 30 Units into the skin daily. 15 mL 3   Insulin Pen Needle (PEN NEEDLES 3/16") 31G X  5 MM MISC Use with insulin pen 100 each 2   levonorgestrel (MIRENA) 20 MCG/24HR IUD 1 each by Intrauterine route once.     losartan (COZAAR) 100 MG tablet Take 100 mg by mouth daily.     megestrol (MEGACE) 20 MG tablet Take 20 mg by mouth daily.     omeprazole (PRILOSEC) 40 MG capsule Take 40 mg by mouth every morning.     OZEMPIC, 0.25 OR 0.5 MG/DOSE, 2 MG/3ML SOPN Inject 0.5 mg into the skin once a week.     ADVAIR HFA 230-21 MCG/ACT inhaler Inhale 2 puffs into the lungs 2 (two) times daily.     rosuvastatin (CRESTOR) 20 MG tablet Take 1 tablet (20 mg total) by mouth at bedtime. 90 tablet 0   sertraline (ZOLOFT) 50 MG tablet Take 1 tablet  by mouth daily.     VENTOLIN HFA 108 (90 Base) MCG/ACT inhaler Inhale 2 puffs into the lungs every 6 (six) hours as needed for shortness of breath.     No facility-administered medications prior to visit.     Review of Systems:   Constitutional: No night sweats, fevers, chills, or lassitude. +weight change, fatigue  HEENT: No difficulty swallowing, tooth/dental problems, or sore throat. No sneezing, itching, ear ache, nasal congestion, or post nasal drip. +morning headaches, dry mouth CV:  No chest pain, orthopnea, PND, swelling in lower extremities, anasarca, dizziness, palpitations, syncope Resp: +snoring, witnessed apneas, baseline shortness of breath with exertion. No excess mucus or change in color of mucus. No productive or non-productive. No hemoptysis. No wheezing.  No chest wall deformity GI:  No heartburn, indigestion GU: No dysuria, change in color of urine, urgency or frequency.   Skin: No rash, lesions, ulcerations MSK:  No joint pain or swelling.   Neuro: No dizziness or lightheadedness.  Psych: No depression. +stable anxiety. Mood stable. +sleep disturbance    Physical Exam:  BP 118/88 (BP Location: Right Arm, Patient Position: Sitting, Cuff Size: Normal)   Pulse (!) 104   Ht 5\' 3"  (1.6 m)   Wt 268 lb 12.8 oz (121.9 kg)   SpO2 98%   BMI 47.62 kg/m   GEN: Pleasant, interactive, well-appearing; morbidly obese; in no acute distress. HEENT:  Normocephalic and atraumatic. PERRLA. Sclera white. Nasal turbinates pink, moist and patent bilaterally. No rhinorrhea present. Oropharynx pink and moist, without exudate or edema. No lesions, ulcerations, or postnasal drip. Mallampati III NECK:  Supple w/ fair ROM. No JVD present. Normal carotid impulses w/o bruits. Thyroid symmetrical with no goiter or nodules palpated. No lymphadenopathy.   CV: RRR, no m/r/g, no peripheral edema. Pulses intact, +2 bilaterally. No cyanosis, pallor or clubbing. PULMONARY:  Unlabored, regular  breathing. Clear bilaterally A&P w/o wheezes/rales/rhonchi. No accessory muscle use.  GI: BS present and normoactive. Soft, non-tender to palpation. No organomegaly or masses detected. MSK: No erythema, warmth or tenderness. Cap refil <2 sec all extrem. No deformities or joint swelling noted.  Neuro: A/Ox3. No focal deficits noted.   Skin: Warm, no lesions or rashe Psych: Normal affect and behavior. Judgement and thought content appropriate.     Lab Results:  CBC    Component Value Date/Time   WBC 6.7 03/11/2022 1922   RBC 4.61 03/11/2022 1922   HGB 14.1 03/11/2022 1922   HCT 41.9 03/11/2022 1922   PLT 375 03/11/2022 1922   MCV 90.9 03/11/2022 1922   MCH 30.6 03/11/2022 1922   MCHC 33.7 03/11/2022 1922   RDW 13.1 03/11/2022 1922  LYMPHSABS 2.1 03/11/2022 1922   MONOABS 0.5 03/11/2022 1922   EOSABS 0.1 03/11/2022 1922   BASOSABS 0.0 03/11/2022 1922    BMET    Component Value Date/Time   NA 137 03/14/2022 0310   K 4.4 03/14/2022 0310   CL 104 03/14/2022 0310   CO2 25 03/14/2022 0310   GLUCOSE 277 (H) 03/14/2022 0310   BUN 10 03/14/2022 0310   CREATININE 0.80 03/14/2022 0310   CALCIUM 8.9 03/14/2022 0310   GFRNONAA >60 03/14/2022 0310   GFRAA >60 01/15/2018 0210    BNP No results found for: "BNP"   Imaging:  No results found.  Administration History     None           No data to display          No results found for: "NITRICOXIDE"      Assessment & Plan:   OSA (obstructive sleep apnea) She has snoring, excessive daytime sleepiness, nocturnal apneic events, morning headaches, restless sleep. History of moderate OSA. BMI 47. History of HTN, DM. Given this,  I am concerned she could have sleep disordered breathing with obstructive sleep apnea. She will need sleep study for further evaluation.    - discussed how weight can impact sleep and risk for sleep disordered breathing - discussed options to assist with weight loss: combination of diet  modification, cardiovascular and strength training exercises   - had an extensive discussion regarding the adverse health consequences related to untreated sleep disordered breathing - specifically discussed the risks for hypertension, coronary artery disease, cardiac dysrhythmias, cerebrovascular disease, and diabetes - lifestyle modification discussed   - discussed how sleep disruption can increase risk of accidents, particularly when driving - safe driving practices were discussed  Patient Instructions  Given your symptoms, I am concerned that you may have sleep disordered breathing with sleep apnea. You will need a sleep study for further evaluation. Someone will contact you to schedule this.   We discussed how untreated sleep apnea puts an individual at risk for cardiac arrhthymias, pulm HTN, DM, stroke and increases their risk for daytime accidents. We also briefly reviewed treatment options including weight loss, side sleeping position, oral appliance, CPAP therapy or referral to ENT for possible surgical options  Use caution when driving and pull over if you become sleepy.  Follow up in 6 weeks with Florentina Addison Akita Maxim,NP to go over sleep study results, or sooner, if needed     Excessive daytime sleepiness See above  Witnessed apneic spells See above  BMI 45.0-49.9, adult (HCC) BMI 47. Healthy weight loss encouraged.    I spent 35 minutes of dedicated to the care of this patient on the date of this encounter to include pre-visit review of records, face-to-face time with the patient discussing conditions above, post visit ordering of testing, clinical documentation with the electronic health record, making appropriate referrals as documented, and communicating necessary findings to members of the patients care team.  Noemi Chapel, NP 04/21/2023  Pt aware and understands NP's role.

## 2023-04-21 NOTE — Assessment & Plan Note (Signed)
BMI 47. Healthy weight loss encouraged.  

## 2023-04-21 NOTE — Assessment & Plan Note (Signed)
She has snoring, excessive daytime sleepiness, nocturnal apneic events, morning headaches, restless sleep. History of moderate OSA. BMI 47. History of HTN, DM. Given this,  I am concerned she could have sleep disordered breathing with obstructive sleep apnea. She will need sleep study for further evaluation.    - discussed how weight can impact sleep and risk for sleep disordered breathing - discussed options to assist with weight loss: combination of diet modification, cardiovascular and strength training exercises   - had an extensive discussion regarding the adverse health consequences related to untreated sleep disordered breathing - specifically discussed the risks for hypertension, coronary artery disease, cardiac dysrhythmias, cerebrovascular disease, and diabetes - lifestyle modification discussed   - discussed how sleep disruption can increase risk of accidents, particularly when driving - safe driving practices were discussed  Patient Instructions  Given your symptoms, I am concerned that you may have sleep disordered breathing with sleep apnea. You will need a sleep study for further evaluation. Someone will contact you to schedule this.   We discussed how untreated sleep apnea puts an individual at risk for cardiac arrhthymias, pulm HTN, DM, stroke and increases their risk for daytime accidents. We also briefly reviewed treatment options including weight loss, side sleeping position, oral appliance, CPAP therapy or referral to ENT for possible surgical options  Use caution when driving and pull over if you become sleepy.  Follow up in 6 weeks with Meredith Delberta Folts,NP to go over sleep study results, or sooner, if needed

## 2023-04-25 DIAGNOSIS — G4733 Obstructive sleep apnea (adult) (pediatric): Secondary | ICD-10-CM

## 2023-04-26 NOTE — Telephone Encounter (Signed)
PCCs can you please advise

## 2023-04-30 NOTE — Telephone Encounter (Signed)
I've updated the order to a split night sleep study. Thanks!

## 2023-04-30 NOTE — Telephone Encounter (Signed)
Patient called back and I informed her I received her message and we are working to put in an in lab sleep study order. Please advise patient when order is in.

## 2023-05-25 ENCOUNTER — Ambulatory Visit: Payer: Commercial Managed Care - PPO | Admitting: Nurse Practitioner

## 2023-06-01 ENCOUNTER — Ambulatory Visit (HOSPITAL_BASED_OUTPATIENT_CLINIC_OR_DEPARTMENT_OTHER): Payer: Commercial Managed Care - PPO | Admitting: Pulmonary Disease

## 2023-06-01 DIAGNOSIS — G4733 Obstructive sleep apnea (adult) (pediatric): Secondary | ICD-10-CM | POA: Diagnosis present

## 2023-06-01 DIAGNOSIS — G4736 Sleep related hypoventilation in conditions classified elsewhere: Secondary | ICD-10-CM | POA: Insufficient documentation

## 2023-06-02 DIAGNOSIS — G4733 Obstructive sleep apnea (adult) (pediatric): Secondary | ICD-10-CM | POA: Diagnosis not present

## 2023-06-02 NOTE — Procedures (Signed)
Patient Name: Meredith Thompson, Meredith Thompson Date: 06/01/2023 Gender: Female D.O.B: 1986-03-18 Age (years): 37 Referring Provider: Noemi Chapel NP Height (inches): 63 Interpreting Physician: Cyril Mourning MD, ABSM Weight (lbs): 265 RPSGT: Shelah Lewandowsky BMI: 47 MRN: [id number] Neck Size: 15.25 <br> <br> CLINICAL INFORMATION Sleep Study Type: NPSG    Indication for sleep study: Diabetes, Fatigue, Hypertension, Obesity, OSA, Sleep walking/talking/parasomnias, Snoring, Witnessed Apneas    Epworth Sleepiness Score: 15    SLEEP STUDY TECHNIQUE As per the AASM Manual for the Scoring of Sleep and Associated Events v2.3 (April 2016) with a hypopnea requiring 4% desaturations.  The channels recorded and monitored were frontal, central and occipital EEG, electrooculogram (EOG), submentalis EMG (chin), nasal and oral airflow, thoracic and abdominal wall motion, anterior tibialis EMG, snore microphone, electrocardiogram, and pulse oximetry.  MEDICATIONS Medications self-administered by patient taken the night of the study : HYDROCHLOROTHIAZIDE, LOSARTAN, MEGESTROL, OMEPRAZOLE, Rosuvastatin  SLEEP ARCHITECTURE The study was initiated at 10:55:10 PM and ended at 5:17:18 AM.  Sleep onset time was 40.0 minutes and the sleep efficiency was 56.1%. The total sleep time was 214.5 minutes.  Stage REM latency was 118.5 minutes.  The patient spent 9.3% of the night in stage N1 sleep, 79.0% in stage N2 sleep, 0.0% in stage N3 and 11.7% in REM.  Alpha intrusion was absent.  Supine sleep was 35.27%.  RESPIRATORY PARAMETERS The overall apnea/hypopnea index (AHI) was 3.9 per hour. There were 0 total apneas, including 0 obstructive, 0 central and 0 mixed apneas. There were 14 hypopneas and 24 RERAs.  The AHI during Stage REM sleep was 12.0 per hour.  AHI while supine was 10.3 per hour.  The mean oxygen saturation was 93.1%. The minimum SpO2 during sleep was 87.0%.  soft snoring was noted during  this study.  CARDIAC DATA The 2 lead EKG demonstrated sinus rhythm. The mean heart rate was 95.3 beats per minute. Other EKG findings include: None.   LEG MOVEMENT DATA The total PLMS were 0 with a resulting PLMS index of 0.0. Associated arousal with leg movement index was 0.0 .  IMPRESSIONS - No significant obstructive sleep apnea occurred during this study (AHI = 3.9/h). - Mild oxygen desaturation was noted during this study (Min O2 = 87.0%). - The patient snored with soft snoring volume. - No cardiac abnormalities were noted during this study. - Clinically significant periodic limb movements did not occur during sleep. No significant associated arousals.   DIAGNOSIS - Mild Nocturnal Hypoxemia (G47.36), does not need oxygen supplementation   RECOMMENDATIONS - Avoid alcohol, sedatives and other CNS depressants that may worsen sleep apnea and disrupt normal sleep architecture. - Sleep hygiene should be reviewed to assess factors that may improve sleep quality. - Weight management and regular exercise should be initiated or continued if appropriate.  [Electronically signed] 06/02/2023 08:20 PM  Cyril Mourning MD, ABSM Diplomate, American Board of Sleep Medicine NPI: [id number]

## 2023-06-29 ENCOUNTER — Ambulatory Visit: Payer: Commercial Managed Care - PPO | Admitting: Nurse Practitioner

## 2023-08-11 ENCOUNTER — Ambulatory Visit: Payer: Commercial Managed Care - PPO | Admitting: Nurse Practitioner

## 2023-10-04 IMAGING — CR DG CHEST 2V
2 series · 2 of 2 positions shown · non-contrast
Comparison: 11/22/2017

CLINICAL DATA: Abdomen pain

EXAM:
CHEST - 2 VIEW

[w chest pa]
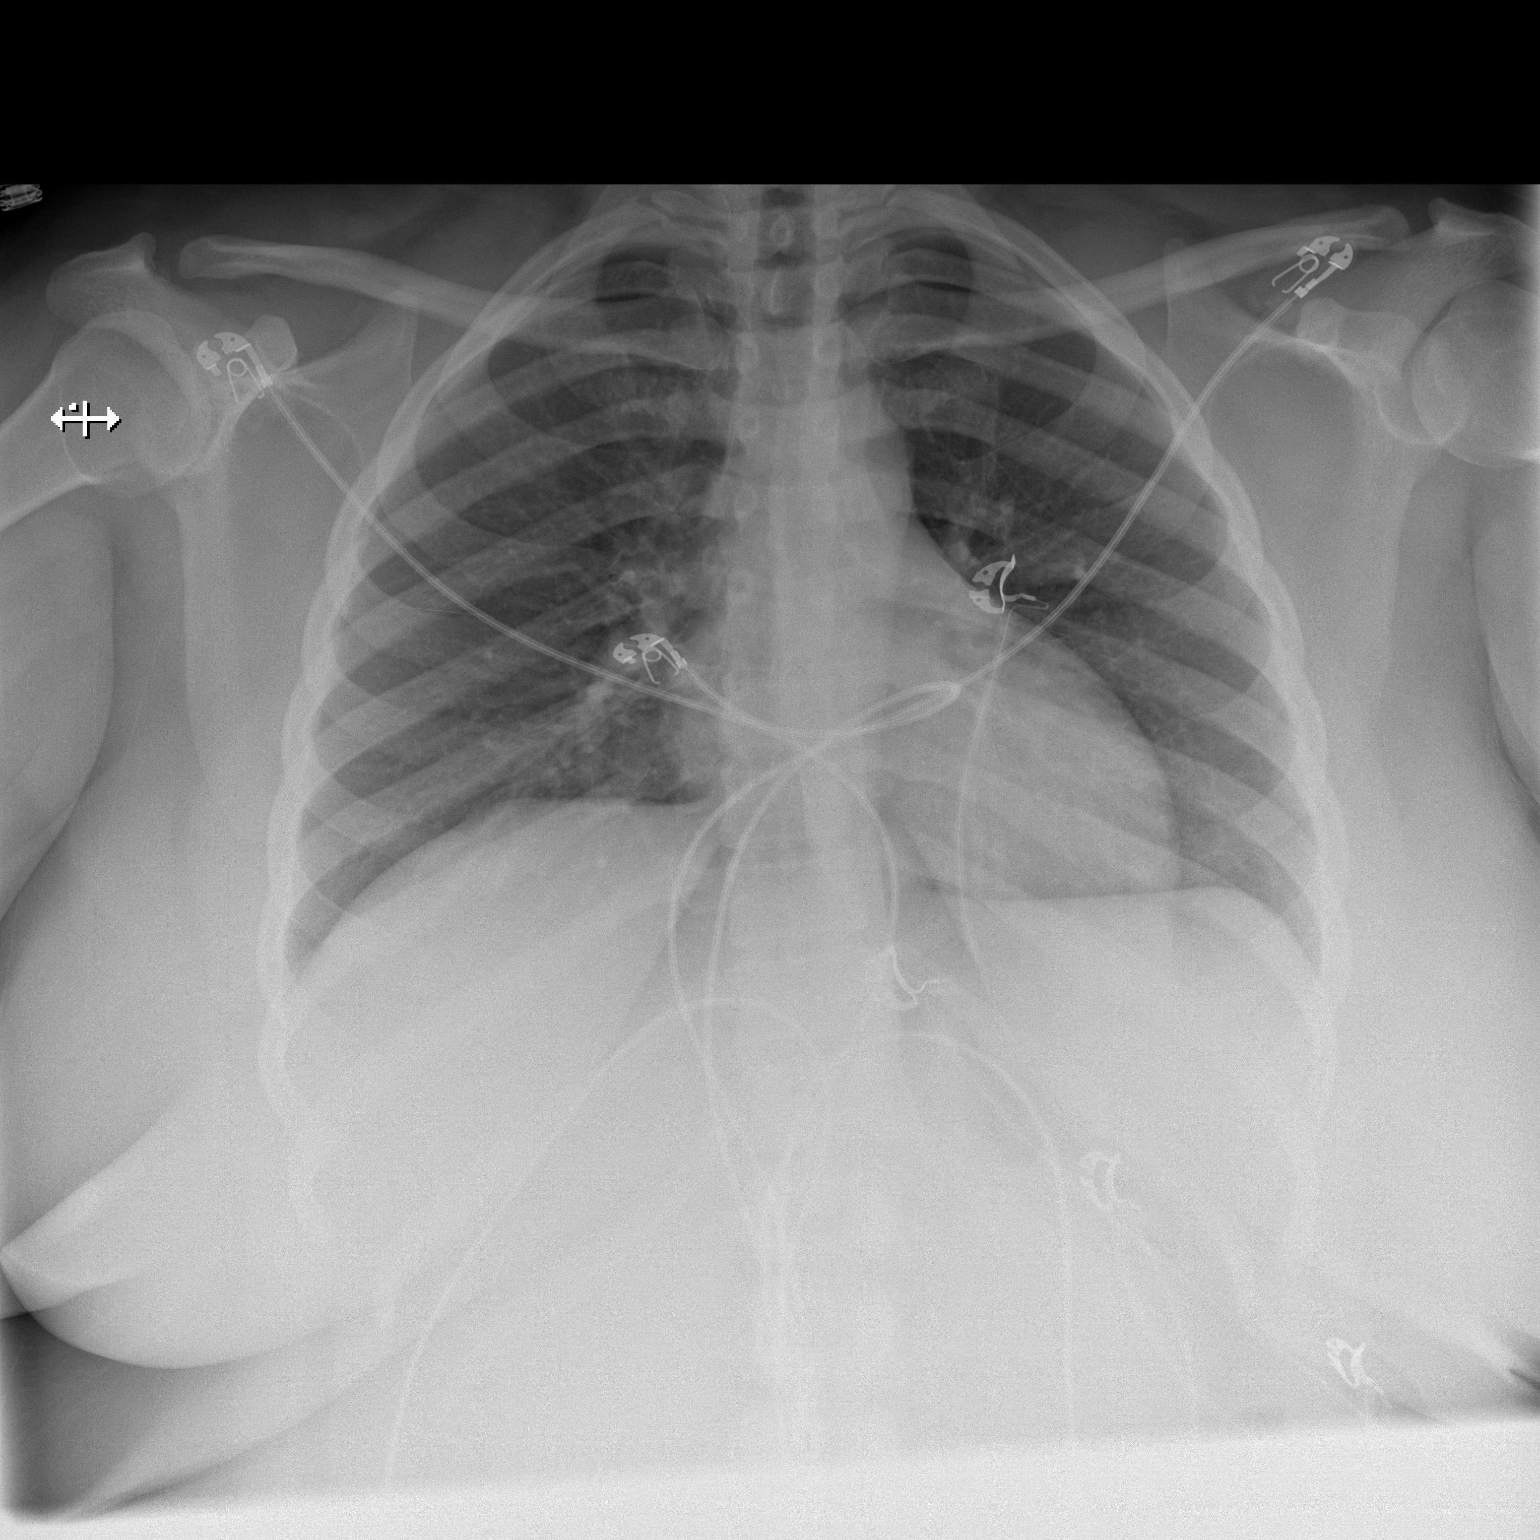

[w chest lat]
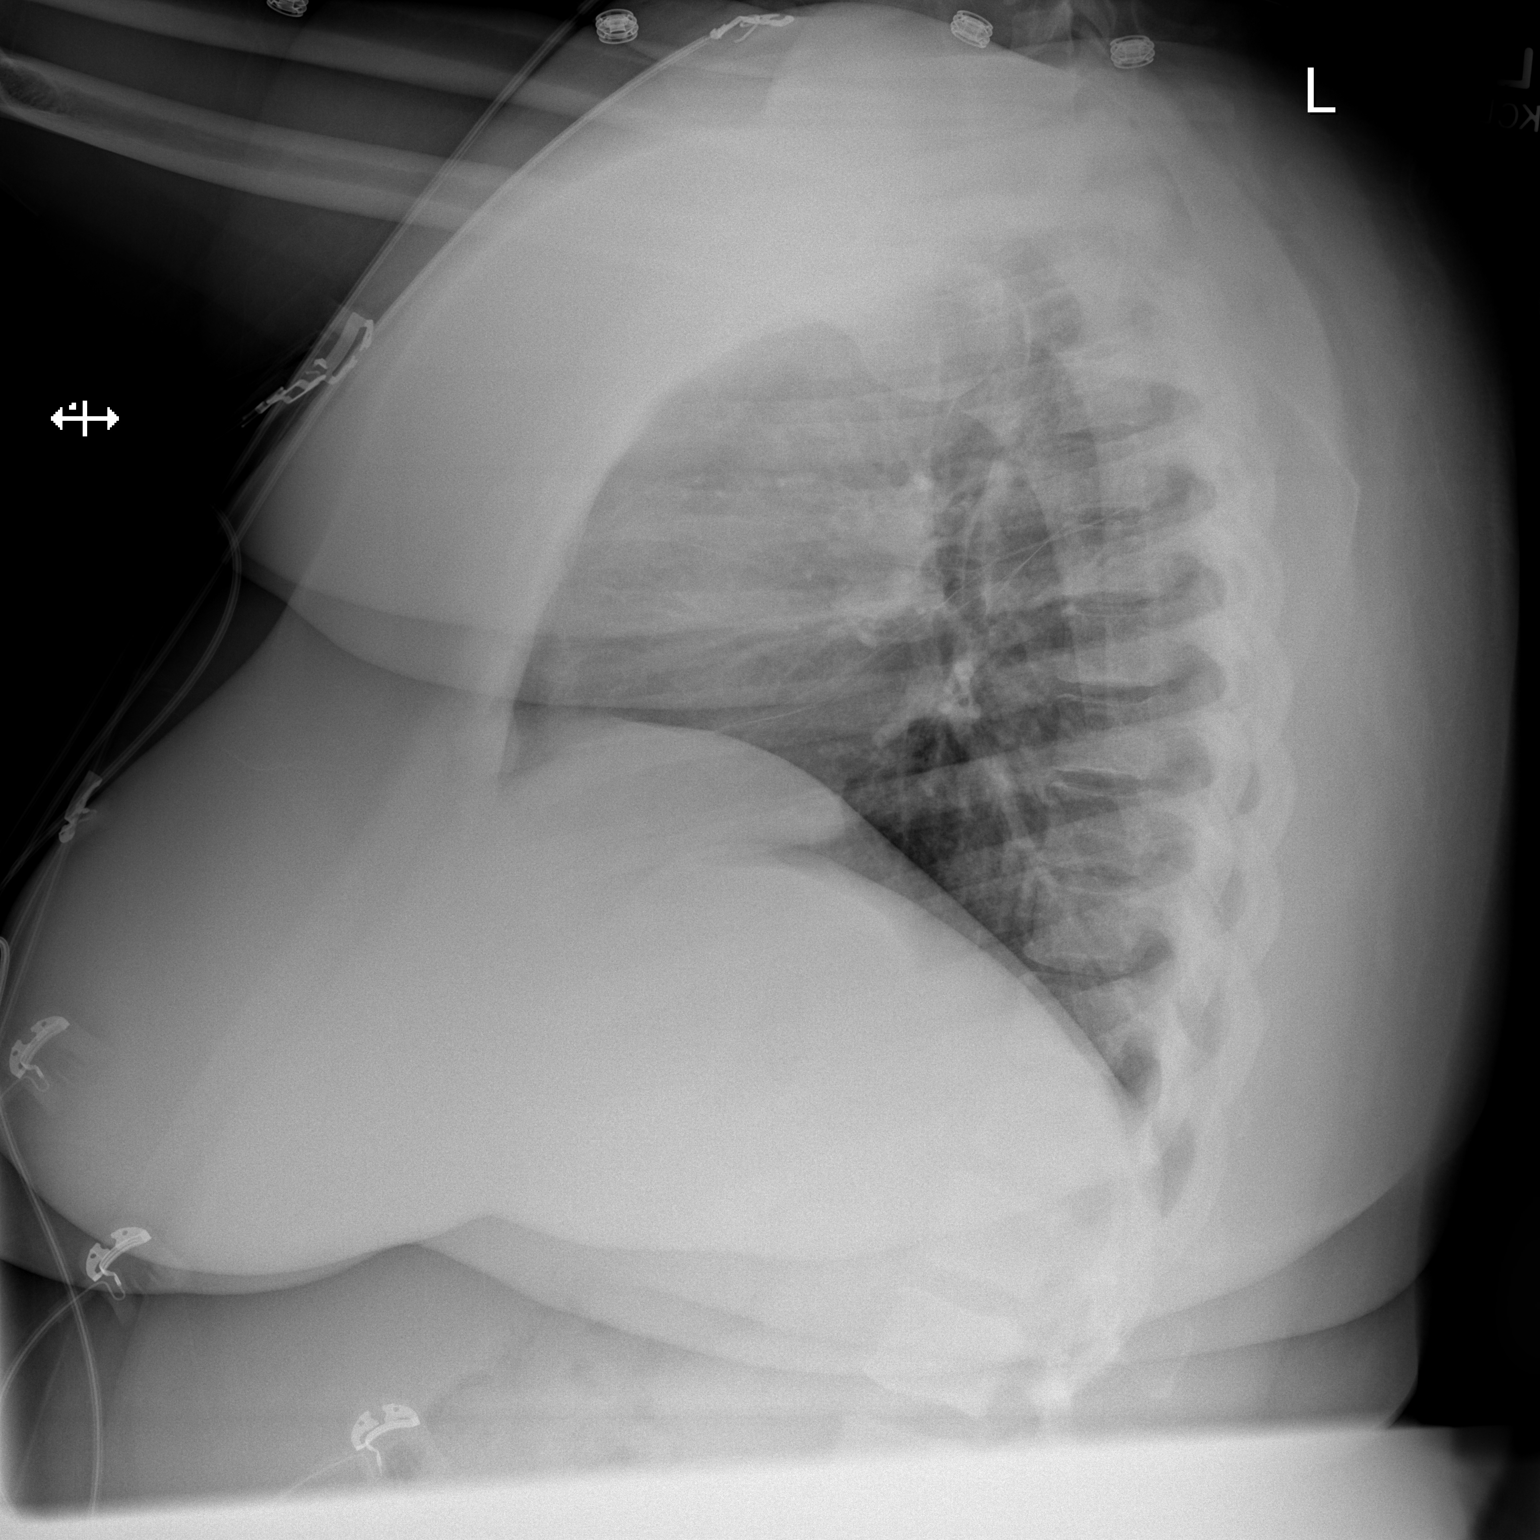

[2 of 2 positions shown; findings below may reference images not displayed]

FINDINGS: The heart size and mediastinal contours are within normal limits.
Both lungs are clear. The visualized skeletal structures are
unremarkable.
IMPRESSION: No active cardiopulmonary disease.

## 2023-10-04 IMAGING — CT CT ABD-PELV W/O CM
2 of 4 series · 17 of 46 positions shown, 19 images · non-contrast
Comparison: CT 01/15/2018

CLINICAL DATA: Flank pain blood in urine



[Series 2: axial st · axial · 0.82mm/px · z∈[-450,+4]mm · 14 of 103 slices shown, 16 images]
[im 6/103  soft-tissue]
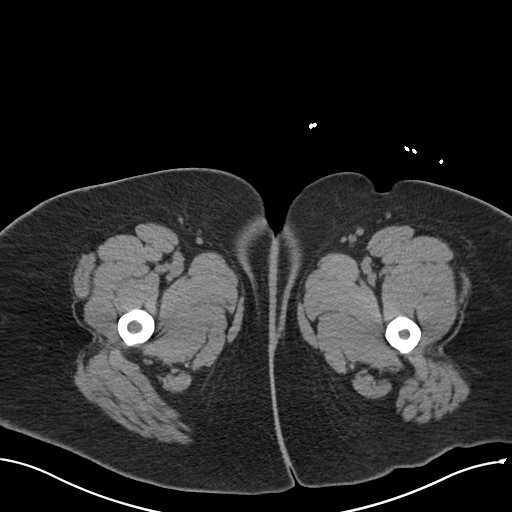
[im 6/103  bone]
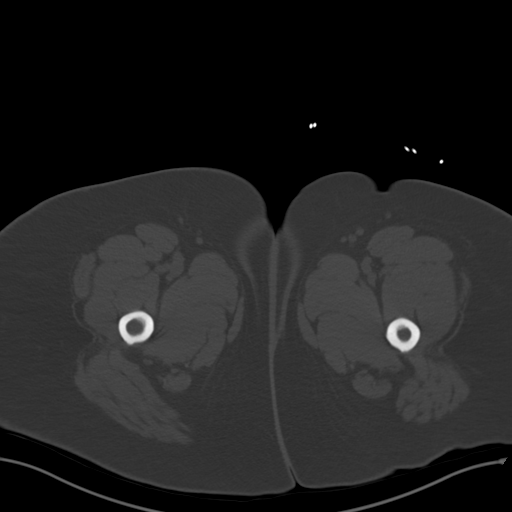
[im 11/103  soft-tissue]
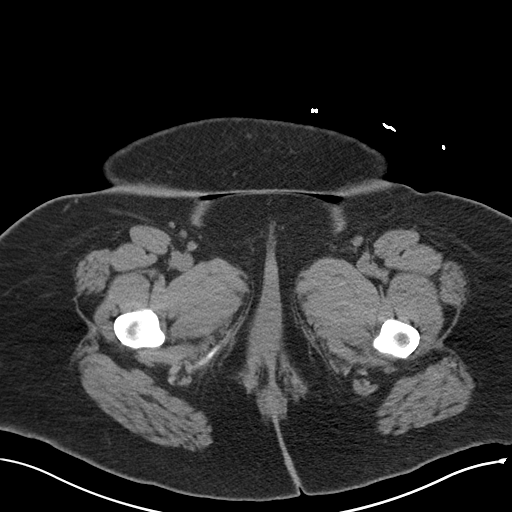
[im 22/103  soft-tissue]
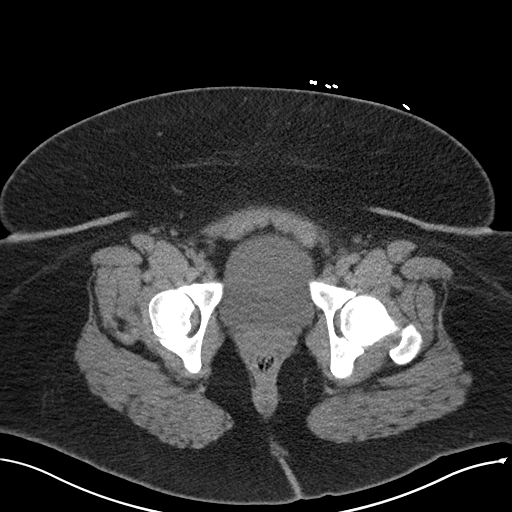
[im 27/103  soft-tissue]
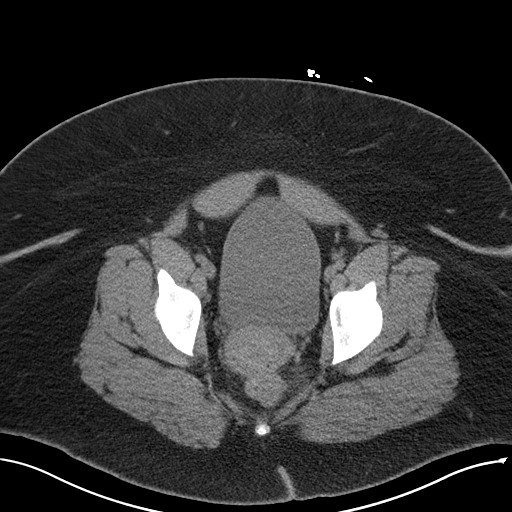
[im 33/103  soft-tissue]
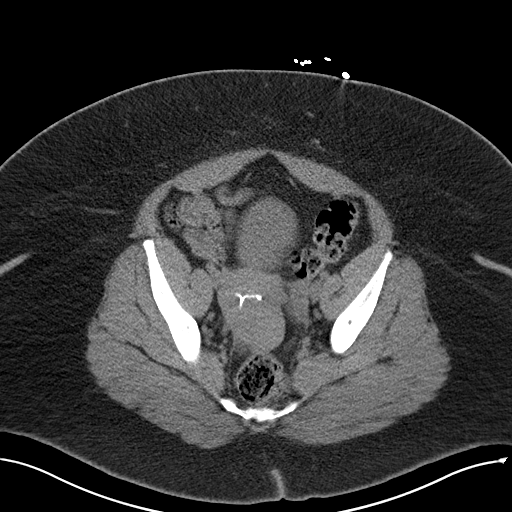
[im 43/103  soft-tissue]
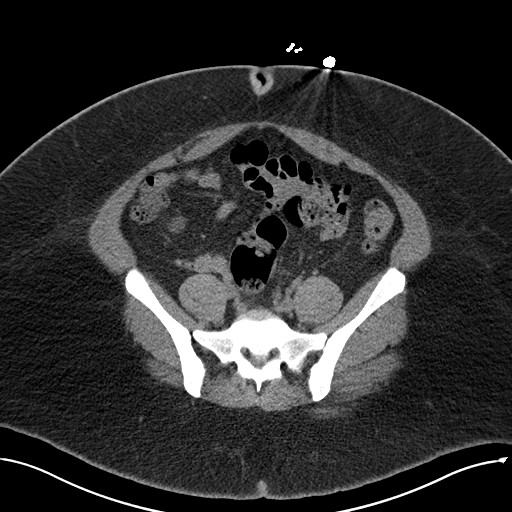
[im 49/103  soft-tissue]
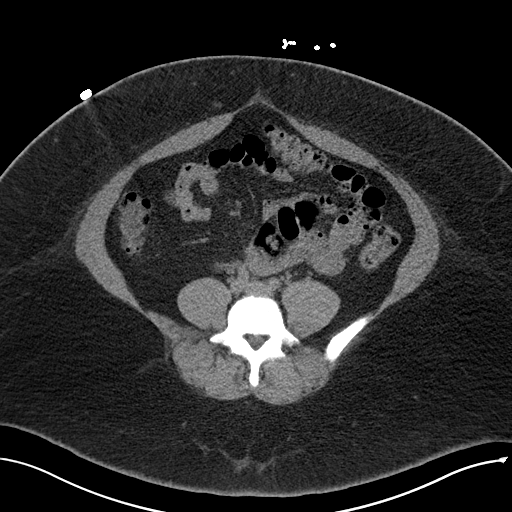
[im 54/103  soft-tissue]
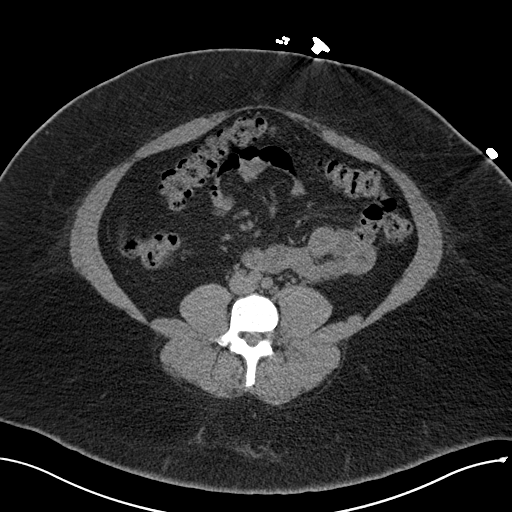
[im 60/103  soft-tissue]
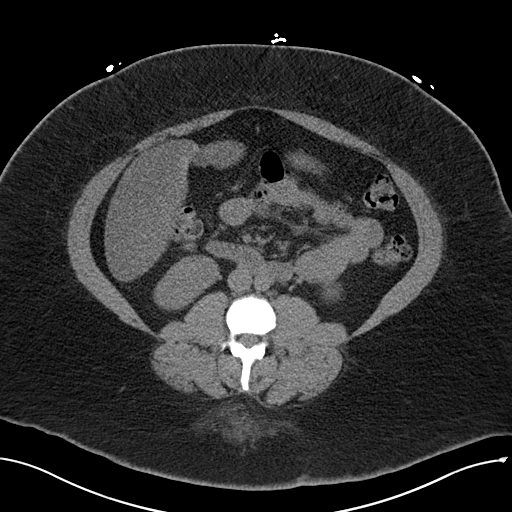
[im 60/103  bone]
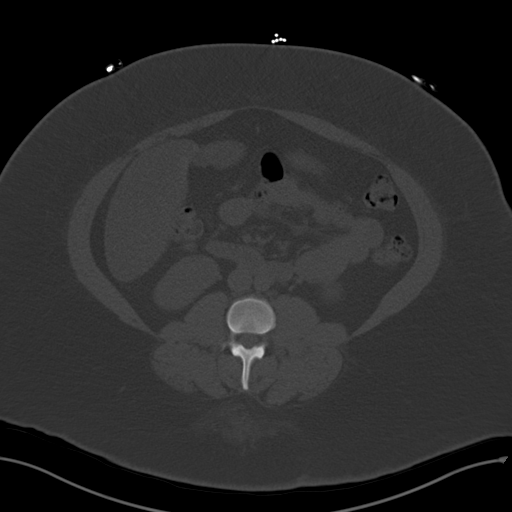
[im 70/103  soft-tissue]
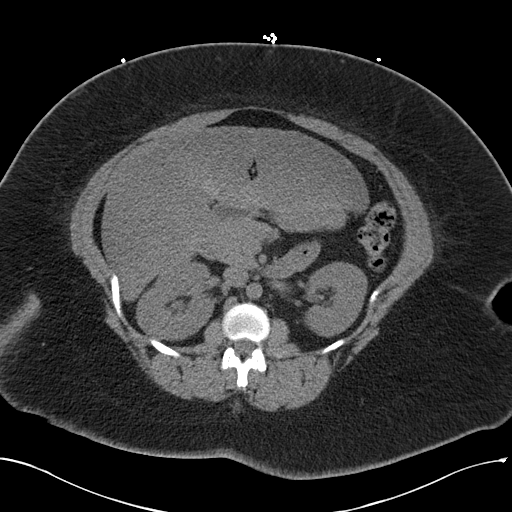
[im 76/103  soft-tissue]
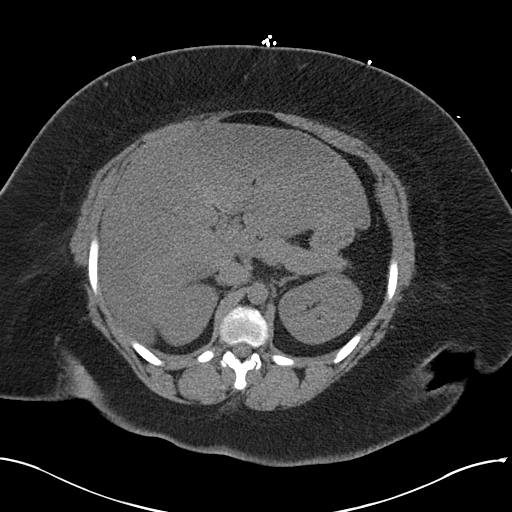
[im 81/103  soft-tissue]
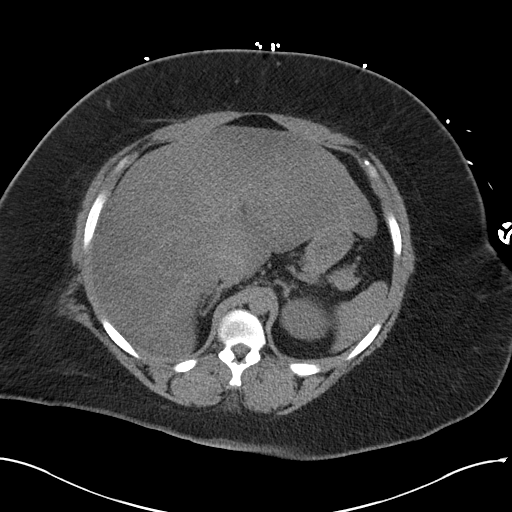
[im 92/103  soft-tissue]
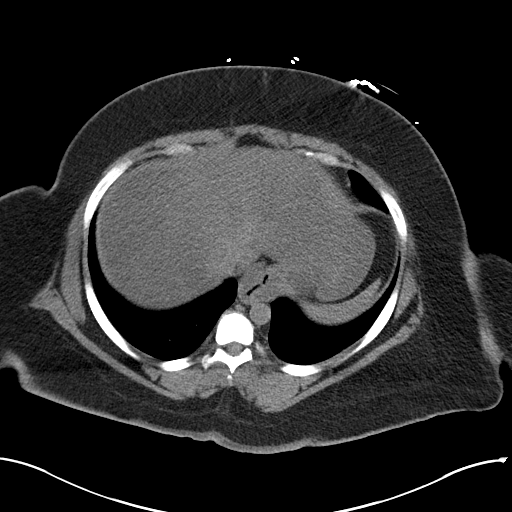
[im 97/103  soft-tissue]
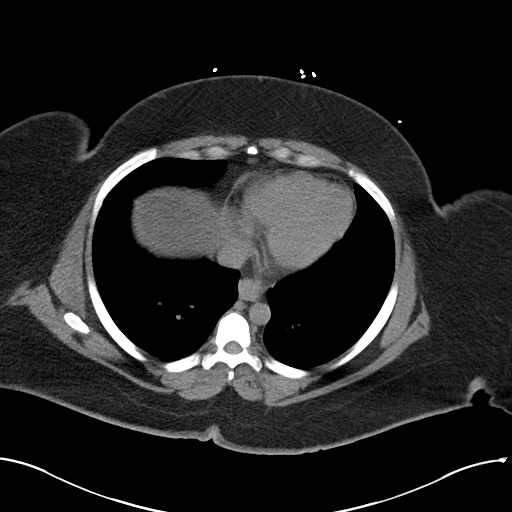

[Series 5: coronal st · coronal · 1.04mm/px · 3 of 203 slices shown]
[im 68/203  soft-tissue]
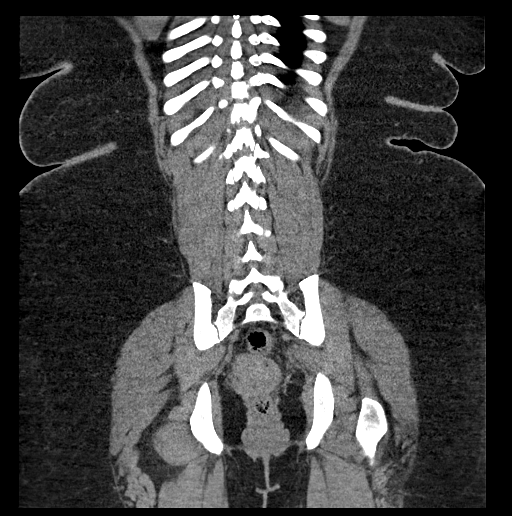
[im 90/203  soft-tissue]
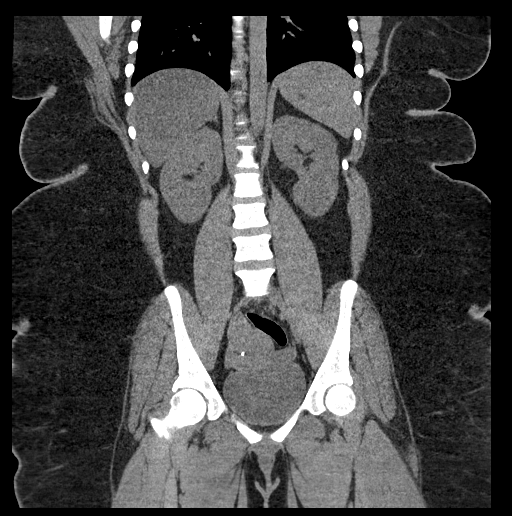
[im 113/203  soft-tissue]
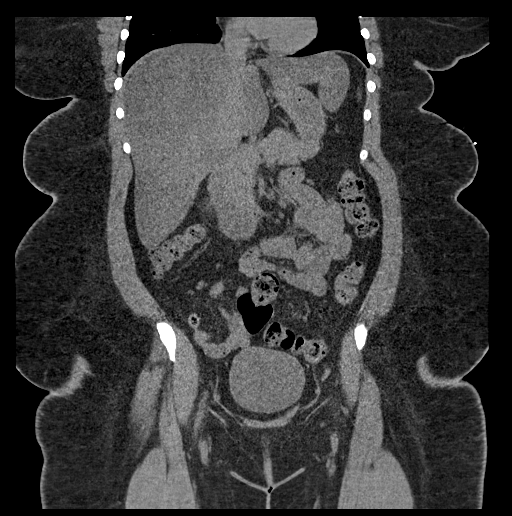

[17 of 46 positions shown; findings below may reference images not displayed]

FINDINGS: Lower chest: No acute abnormality.

Hepatobiliary: Liver is enlarged measuring 22 cm. Diffuse
hypodensity consistent with hepatic steatosis. No calcified
gallstone or biliary dilatation

Pancreas: Unremarkable. No pancreatic ductal dilatation or
surrounding inflammatory changes.

Spleen: Normal in size without focal abnormality.

Adrenals/Urinary Tract: Adrenal glands are unremarkable. Kidneys are
normal, without renal calculi, focal lesion, or hydronephrosis.
Bladder is unremarkable.

Stomach/Bowel: Stomach is within normal limits. Appendix appears
normal. No evidence of bowel wall thickening, distention, or
inflammatory changes.

Vascular/Lymphatic: No significant vascular findings are present. No
enlarged abdominal or pelvic lymph nodes.

Reproductive: IUD in the uterus.  No adnexal mass

Other: No abdominal wall hernia or abnormality. No abdominopelvic
ascites.

Musculoskeletal: No acute or significant osseous findings.
IMPRESSION: 1. No CT evidence for acute intra-abdominal or pelvic abnormality.
2. Enlarged fatty liver

## 2024-07-18 ENCOUNTER — Emergency Department (HOSPITAL_COMMUNITY)
Admission: EM | Admit: 2024-07-18 | Discharge: 2024-07-18 | Disposition: A | Discharge status: Home / Self Care | Attending: Emergency Medicine | Admitting: Emergency Medicine

## 2024-07-18 ENCOUNTER — Other Ambulatory Visit: Payer: Self-pay

## 2024-07-18 ENCOUNTER — Encounter (HOSPITAL_COMMUNITY): Payer: Self-pay

## 2024-07-18 DIAGNOSIS — M79605 Pain in left leg: Secondary | ICD-10-CM | POA: Insufficient documentation

## 2024-07-18 DIAGNOSIS — M79604 Pain in right leg: Secondary | ICD-10-CM | POA: Diagnosis not present

## 2024-07-18 DIAGNOSIS — Z794 Long term (current) use of insulin: Secondary | ICD-10-CM | POA: Insufficient documentation

## 2024-07-18 DIAGNOSIS — Z79899 Other long term (current) drug therapy: Secondary | ICD-10-CM | POA: Insufficient documentation

## 2024-07-18 MED ORDER — HYDROCODONE-ACETAMINOPHEN 5-325 MG PO TABS
1.0000 | ORAL_TABLET | Freq: Once | ORAL | Status: AC
  Administered 2024-07-18: 1 via ORAL
  Filled 2024-07-18: qty 1

## 2024-07-18 NOTE — Discharge Instructions (Addendum)
 Evaluation today was overall reassuring.  I agree that it would be reasonable for you to follow-up in the DVT clinic.  Please call their office and schedule an appointment.  They are open tomorrow.  In the meantime you can use Tylenol  and ibuprofen for pain and apply ice in areas that hurt.  If you develop discoloration or swelling in your legs or worsening pain or chest pain or shortness of breath please return to ED for further evaluation.

## 2024-07-18 NOTE — ED Triage Notes (Addendum)
 Pt presents via POV c/o chronic bilateral leg pain over the last few months. Reports hx of neuropathy on Neurotin. Reports pain has been persistent for the past few months. Denies swelling. Denies injury.

## 2024-07-18 NOTE — ED Provider Notes (Signed)
 [place] EMERGENCY DEPARTMENT AT [facility] Provider Note   CSN: [id number] Arrival date & time: 07/18/24  1919     Patient presents with: Leg Pain  HPI Meredith Thompson is a 38 y.o. female presenting for bilateral leg pain.  States has been going on for several months.  Pain is along the anterior aspect of the upper and lower legs bilaterally.  She states its sharp pain that radiates down her legs at times.  She does take Neurontin for neuropathy which which helps.  Denies any notable swelling or discoloration in her legs.  Denies chest pain shortness of breath.  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  Denies trauma.    Leg Pain      Prior to Admission medications   Medication Sig Start Date End Date Taking? Authorizing Provider  ADVAIR HFA 230-21 MCG/ACT inhaler Inhale 2 puffs into the lungs 2 (two) times daily.    [provider]  blood glucose meter kit and supplies Dispense based on patient and insurance preference. Use up to four times daily as directed. (FOR ICD-10 E10.9, E11.9). 03/14/22   Drusilla Sabas RAMAN, MD  hydrochlorothiazide (HYDRODIURIL) 25 MG tablet Take 25 mg by mouth daily.    [provider]  insulin  aspart (NOVOLOG ) 100 UNIT/ML FlexPen Inject 10 Units into the skin 3 (three) times daily with meals. 03/14/22   Drusilla Sabas RAMAN, MD  insulin  glargine (LANTUS ) 100 UNIT/ML Solostar Pen Inject 30 Units into the skin daily. 03/15/22   Drusilla Sabas RAMAN, MD  Insulin  Pen Needle (PEN NEEDLES 3/16) 31G X 5 MM MISC Use with insulin  pen 03/14/22   Drusilla Sabas RAMAN, MD  levonorgestrel (MIRENA) 20 MCG/24HR IUD 1 each by Intrauterine route once.    [provider]  losartan (COZAAR) 100 MG tablet Take 100 mg by mouth daily. 08/03/22   [provider]  megestrol (MEGACE) 20 MG tablet Take 20 mg by mouth daily.    [provider]  omeprazole (PRILOSEC) 40 MG capsule Take 40 mg by mouth every  morning. 11/15/22   [provider]  OZEMPIC, 0.25 OR 0.5 MG/DOSE, 2 MG/3ML SOPN Inject 0.5 mg into the skin once a week. 10/20/22   [provider]  rosuvastatin  (CRESTOR ) 20 MG tablet Take 1 tablet (20 mg total) by mouth at bedtime. 12/15/22 03/15/23  Tolia, Sunit, DO  sertraline (ZOLOFT) 50 MG tablet Take 1 tablet by mouth daily. 12/02/22   [provider]  VENTOLIN  HFA 108 (90 Base) MCG/ACT inhaler Inhale 2 puffs into the lungs every 6 (six) hours as needed for shortness of breath. 12/02/22   [provider]    Allergies: Metformin and Wellbutrin [bupropion]    Review of Systems See HPI  Updated Vital Signs BP (!) 148/108   Pulse (!) 106   Temp 98.6 F (37 C) (Oral)   Resp 18   SpO2 100%   Physical Exam Vitals and nursing note reviewed.  HENT:     Head: Normocephalic and atraumatic.     Mouth/Throat:     Mouth: Mucous membranes are moist.  Eyes:     General:        Right eye: No discharge.        Left eye: No discharge.     Conjunctiva/sclera: Conjunctivae normal.  Cardiovascular:     Rate and Rhythm: Normal rate and regular rhythm.     Pulses: Normal pulses.  Dorsalis pedis pulses are 2+ on the right side and 2+ on the left side.     Heart sounds: Normal heart sounds.  Pulmonary:     Effort: Pulmonary effort is normal.     Breath sounds: Normal breath sounds.  Abdominal:     General: Abdomen is flat.     Palpations: Abdomen is soft.  Musculoskeletal:     Comments: No notable swelling or discoloration in the legs bilaterally.  No calf tenderness with palpation.  She is able to ambulate and bear weight.  Skin:    General: Skin is warm and dry.  Neurological:     General: No focal deficit present.  Psychiatric:        Mood and Affect: Mood normal.     (all labs ordered are listed, but only abnormal results are displayed) Labs Reviewed - No data to display  EKG: None  Radiology: No results found.   Procedures    Medications Ordered in the ED  HYDROcodone-acetaminophen  (NORCO/VICODIN) 5-325 MG per tablet 1 tablet (has no administration in time range)                                    Medical Decision Making  38 year old well-appearing female presenting for bilateral leg pain.  Exam was unremarkable.  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  However clinically my suspicion is low that she has developed acute DVT.  Feel it is reasonable however to have her look at the DVT clinic for an ultrasound.  Did discuss return precautions.  Advised Tylenol  and ibuprofen for pain.  Also advised PCP follow-up.     Final diagnoses:  Bilateral leg pain    ED Discharge Orders          Ordered    LE Venous       Comments: IMPORTANT PATIENT INSTRUCTIONS: You have been scheduled for an Outpatient Vascular Study at [facility].  If tomorrow is a Saturday, Sunday or holiday, please go to the [facility] Emergency Department Registration Desk at 11 am tomorrow morning and tell them you are there for a vascular study.  If tomorrow is a weekday (Monday-Friday), please go to the [facility] (address [address], [place]) at 8 am and report to the 4th floor registration Zone A.  Inform registration that you are there for a vascular study.   07/18/24 2132               Lang Norleen POUR, PA-C 07/18/24 2135    Lenor Hollering, MD 07/18/24 2340

## 2024-07-19 ENCOUNTER — Other Ambulatory Visit: Payer: Self-pay | Admitting: Medical Genetics

## 2024-07-19 ENCOUNTER — Ambulatory Visit (HOSPITAL_COMMUNITY)
Admission: RE | Admit: 2024-07-19 | Discharge: 2024-07-19 | Disposition: A | Discharge status: Home / Self Care | Source: Ambulatory Visit | Attending: Emergency Medicine | Admitting: Emergency Medicine

## 2024-07-19 ENCOUNTER — Ambulatory Visit (HOSPITAL_COMMUNITY)

## 2024-07-19 DIAGNOSIS — M79662 Pain in left lower leg: Secondary | ICD-10-CM | POA: Diagnosis not present

## 2024-07-19 DIAGNOSIS — M7989 Other specified soft tissue disorders: Secondary | ICD-10-CM | POA: Insufficient documentation

## 2024-07-19 DIAGNOSIS — E119 Type 2 diabetes mellitus without complications: Secondary | ICD-10-CM | POA: Insufficient documentation

## 2024-07-19 DIAGNOSIS — M79661 Pain in right lower leg: Secondary | ICD-10-CM | POA: Insufficient documentation

## 2024-10-02 ENCOUNTER — Other Ambulatory Visit (HOSPITAL_COMMUNITY)

## 2024-10-09 ENCOUNTER — Other Ambulatory Visit (HOSPITAL_COMMUNITY)

## 2024-11-07 ENCOUNTER — Other Ambulatory Visit (HOSPITAL_COMMUNITY): Payer: Self-pay

## 2024-11-08 ENCOUNTER — Other Ambulatory Visit: Payer: Self-pay | Admitting: Medical Genetics

## 2024-11-08 DIAGNOSIS — Z006 Encounter for examination for normal comparison and control in clinical research program: Secondary | ICD-10-CM
